# Patient Record
Sex: Male | Born: 1945 | Race: White | Hispanic: No
Health system: Midwestern US, Academic
[De-identification: ages and names within clinical notes are randomized; demographics above are authoritative.]

---

## 2013-03-18 IMAGING — CR UGI AIR W/KUB
4 series · 15 of 24 positions shown · non-contrast
Comparison: None.

HISTORY: Abdominal pain.
TECHNIQUE: Air contrast upper GI.  Fluoro time 1.2 minutes.

[view not recorded (1 of 2)]
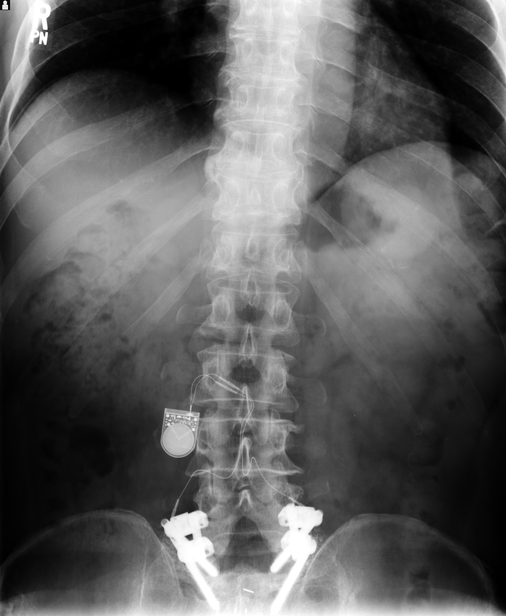

[upper_gi 1 · tomo slice 7/13.0]
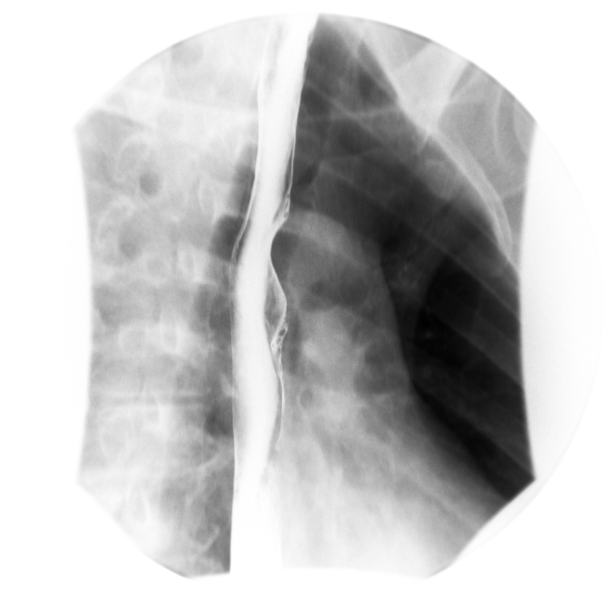

[Series 1: view not recorded · 0.17mm/px · 2 of 3 slices shown (2 of 2)]
[im 1/3]
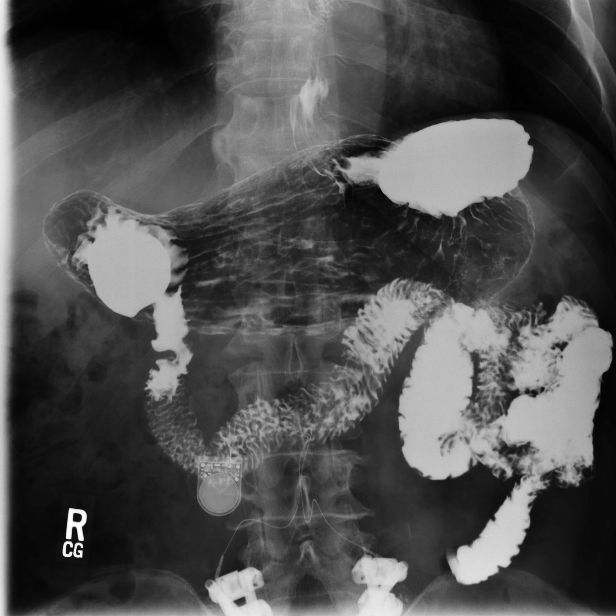
[im 2/3]
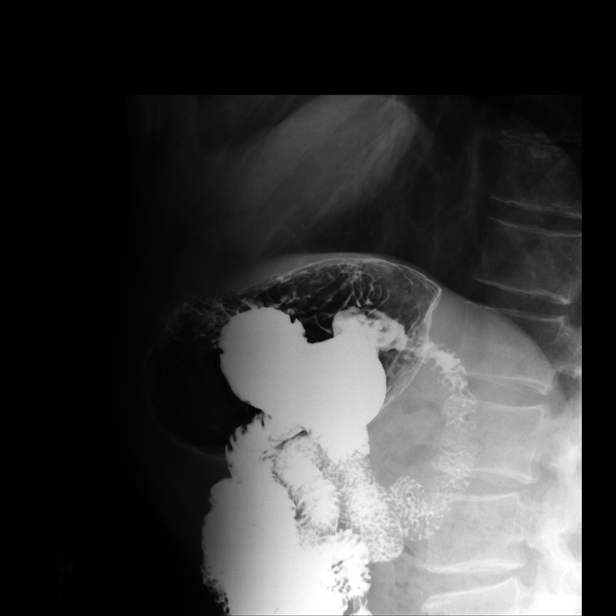

[Series 2: upper_gi 2 · 11 of 17 slices shown]
[im 1/17]
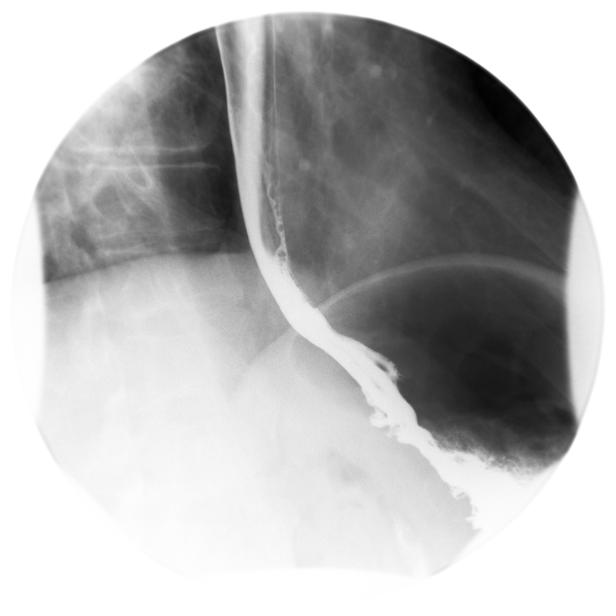
[im 2/17]
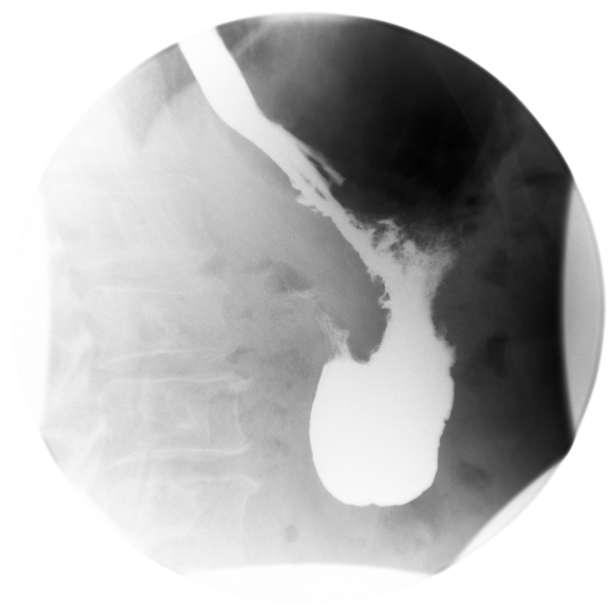
[im 4/17]
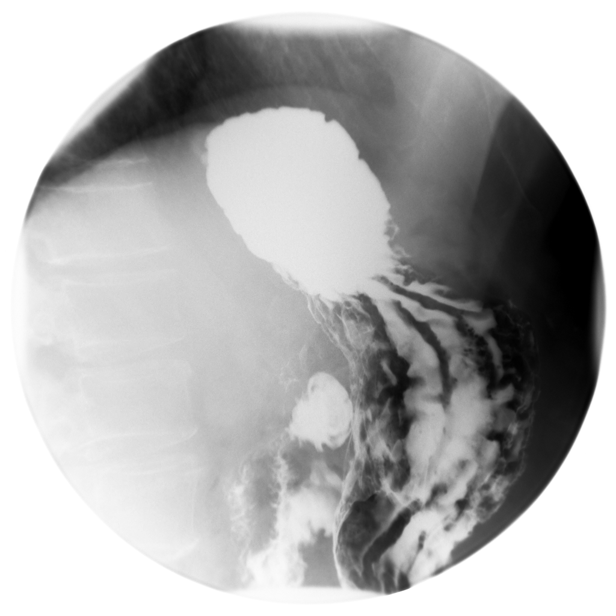
[im 6/17]
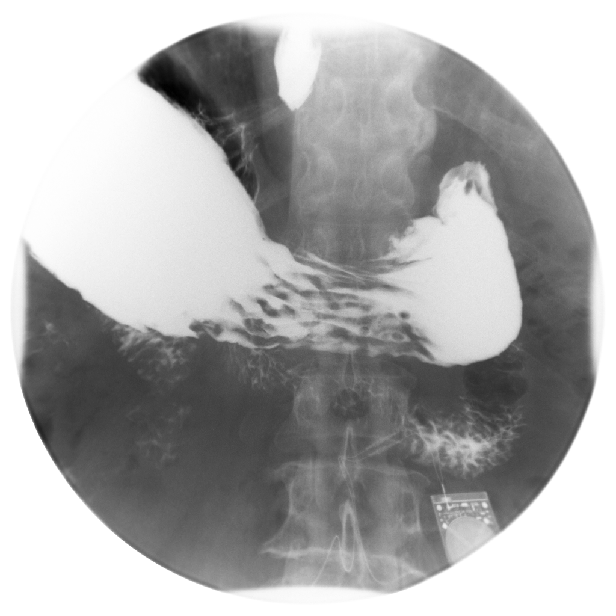
[im 7/17]
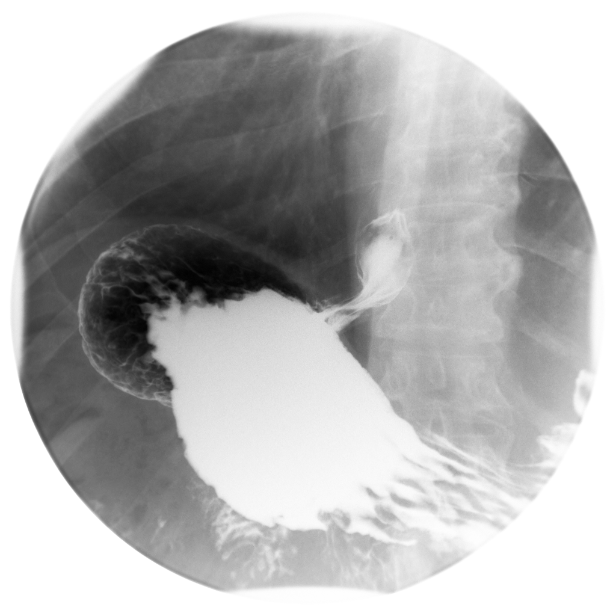
[im 9/17]
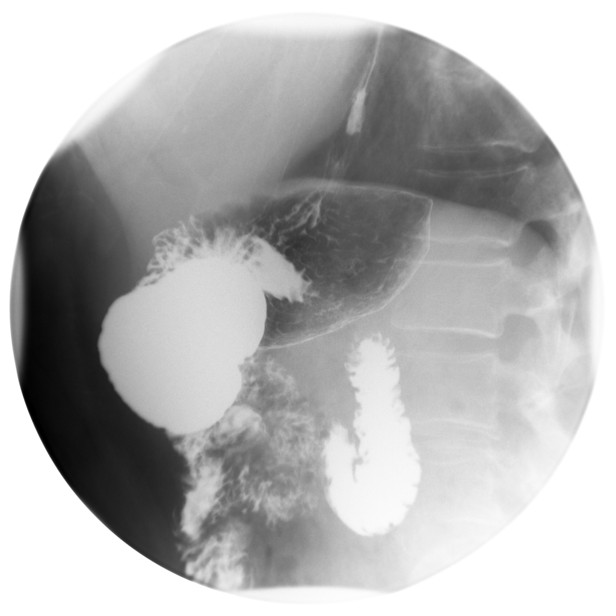
[im 10/17]
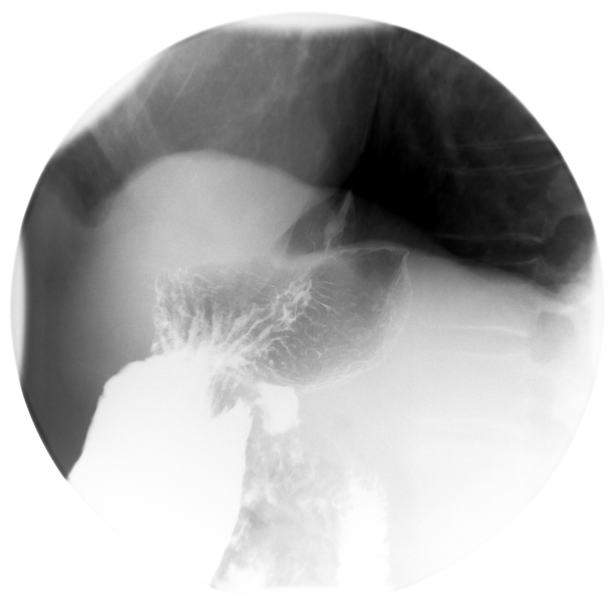
[im 12/17]
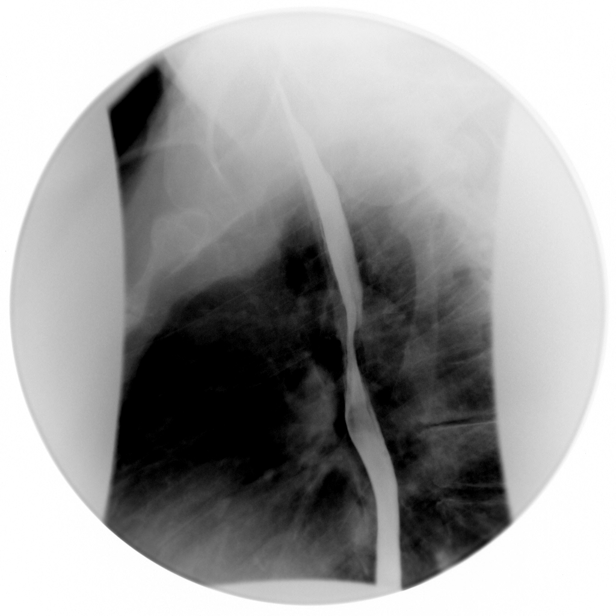
[im 14/17]
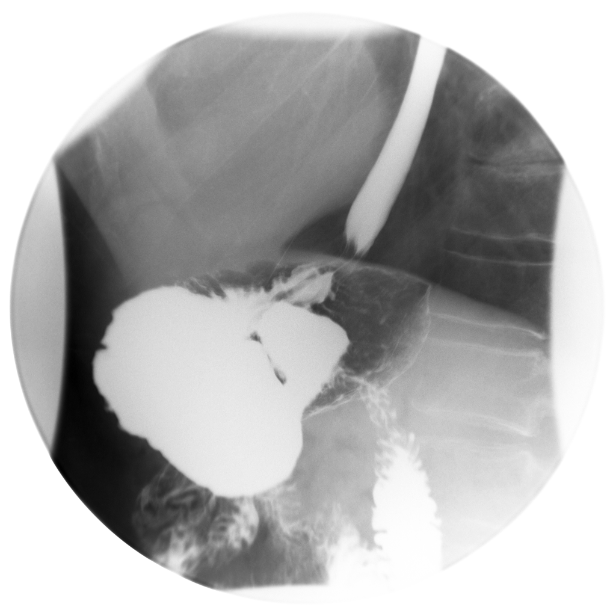
[im 15/17]
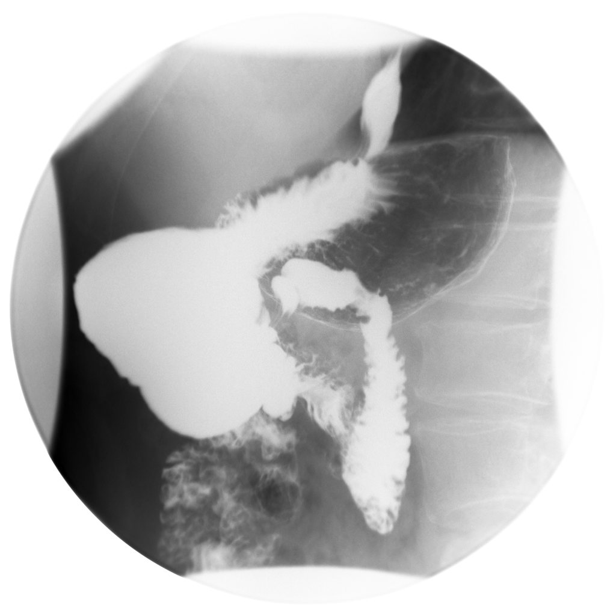
[im 17/17]
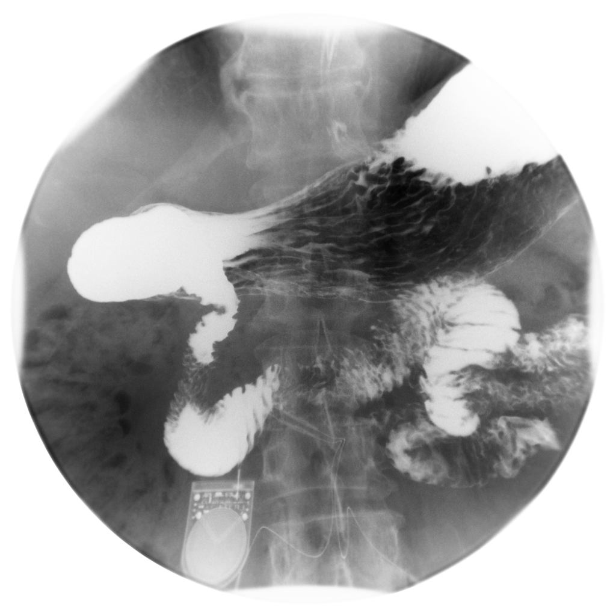

[15 of 24 positions shown; findings below may reference images not displayed]

FINDINGS: Scout KUB shows normal gas pattern. Patient has had back surgery with pedicle screws in the lower lumbar spine with a bone stimulator device. Patient has also had ACDF in the upper C-spine.

Initiation of swallow is normal. Esophagus showed no hiatus hernia or reflux.

Contrast study of stomach was normal without evidence of ulceration or mass and the mucosal pattern was normal and peristalsis was normal.

The duodenal bulb, and duodenal C-loop are normal.
IMPRESSION: Normal air contrast upper GI.

## 2013-03-25 IMAGING — CR KNEE LT 4 VWS MIN
1 series · 4 of 4 positions shown · non-contrast
Comparison: None

Left knee 4 views
INDICATION: Pain

[Series 1: lat · 0.17mm/px · 4 of 4 slices shown]
[im 1/4]
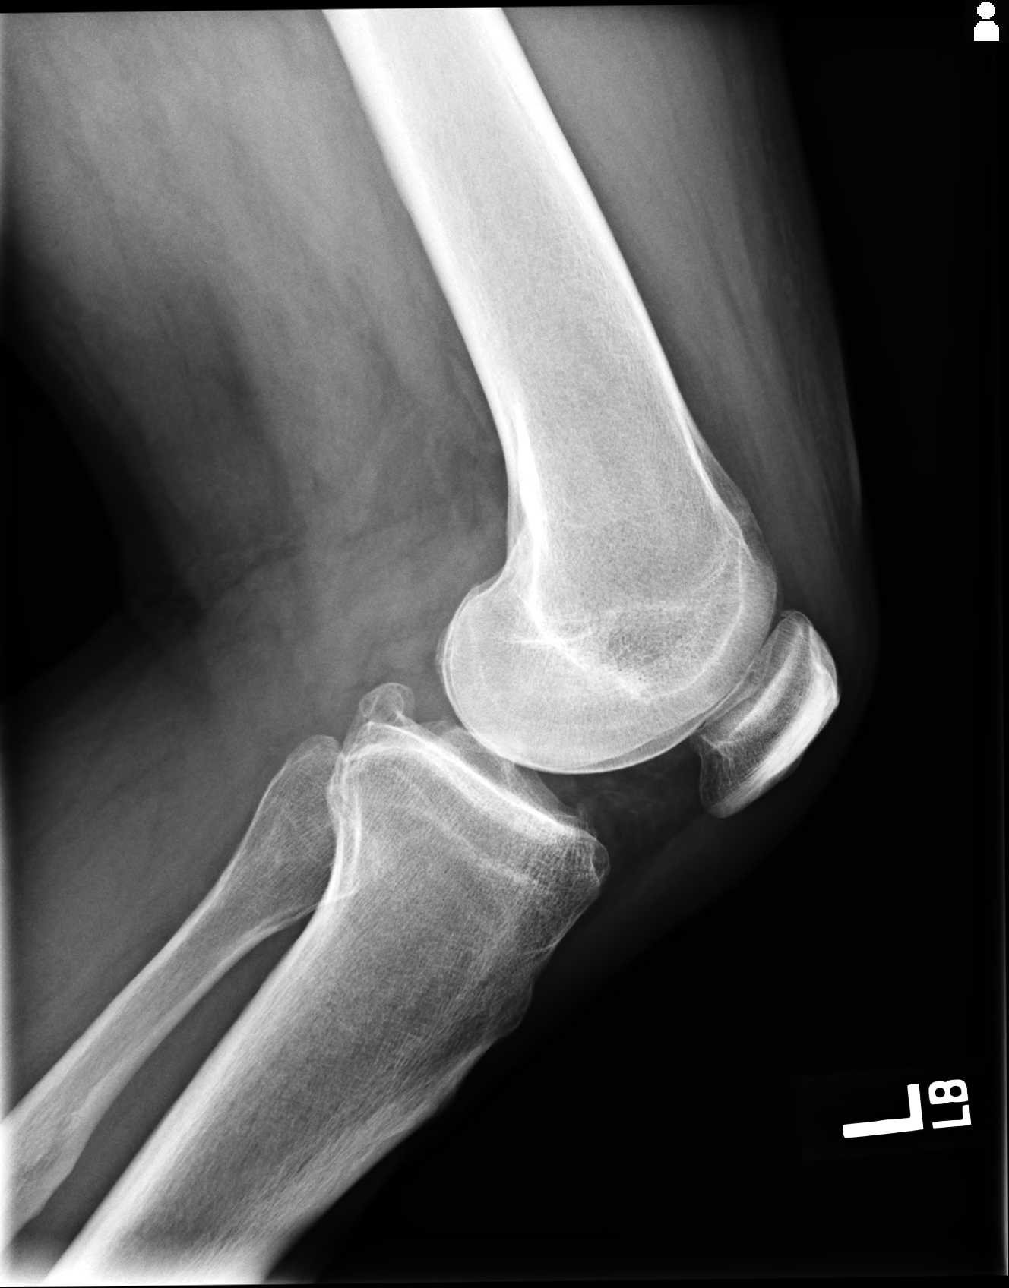
[im 2/4]
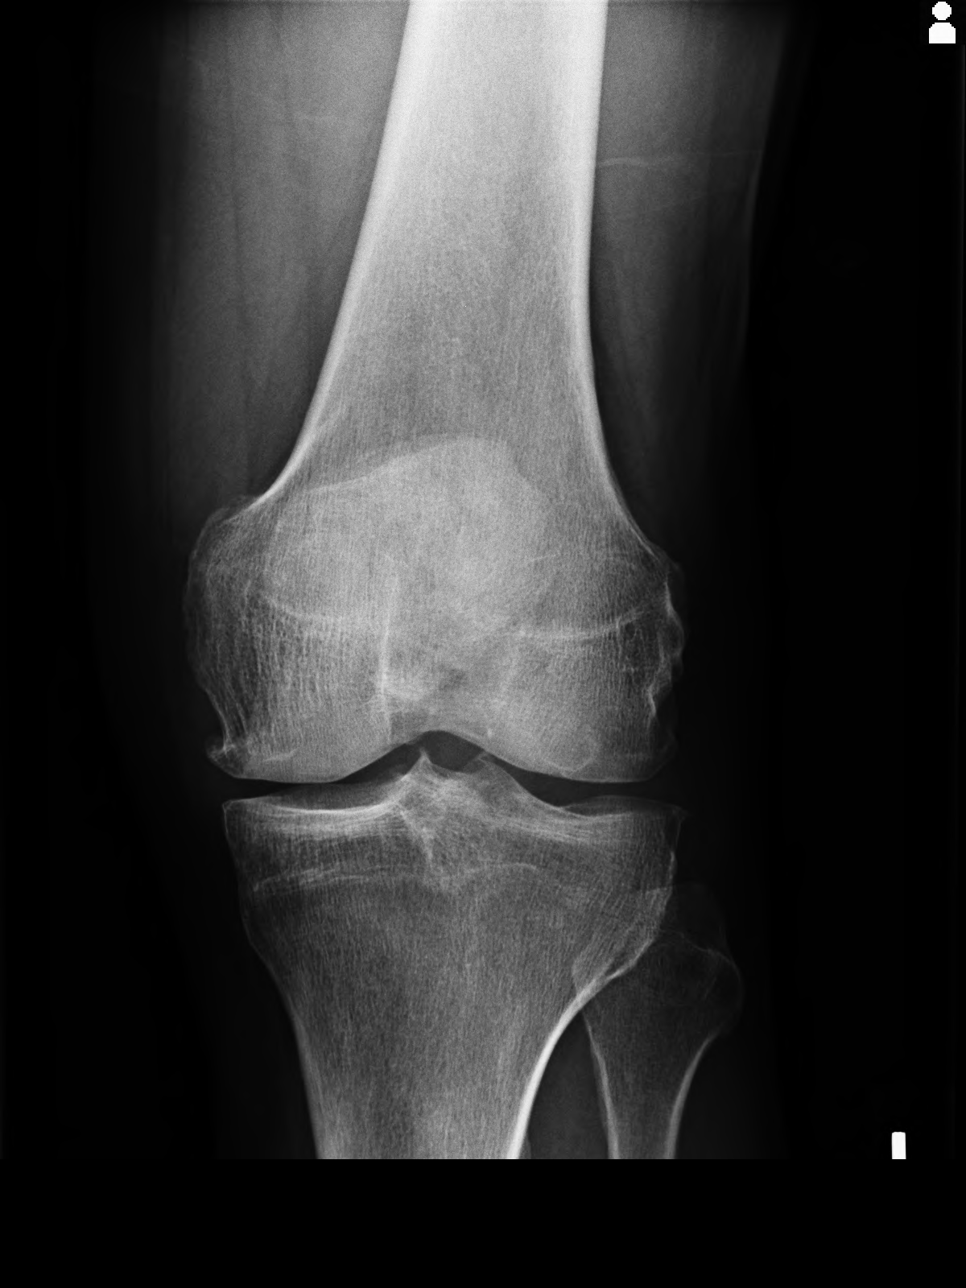
[im 3/4]
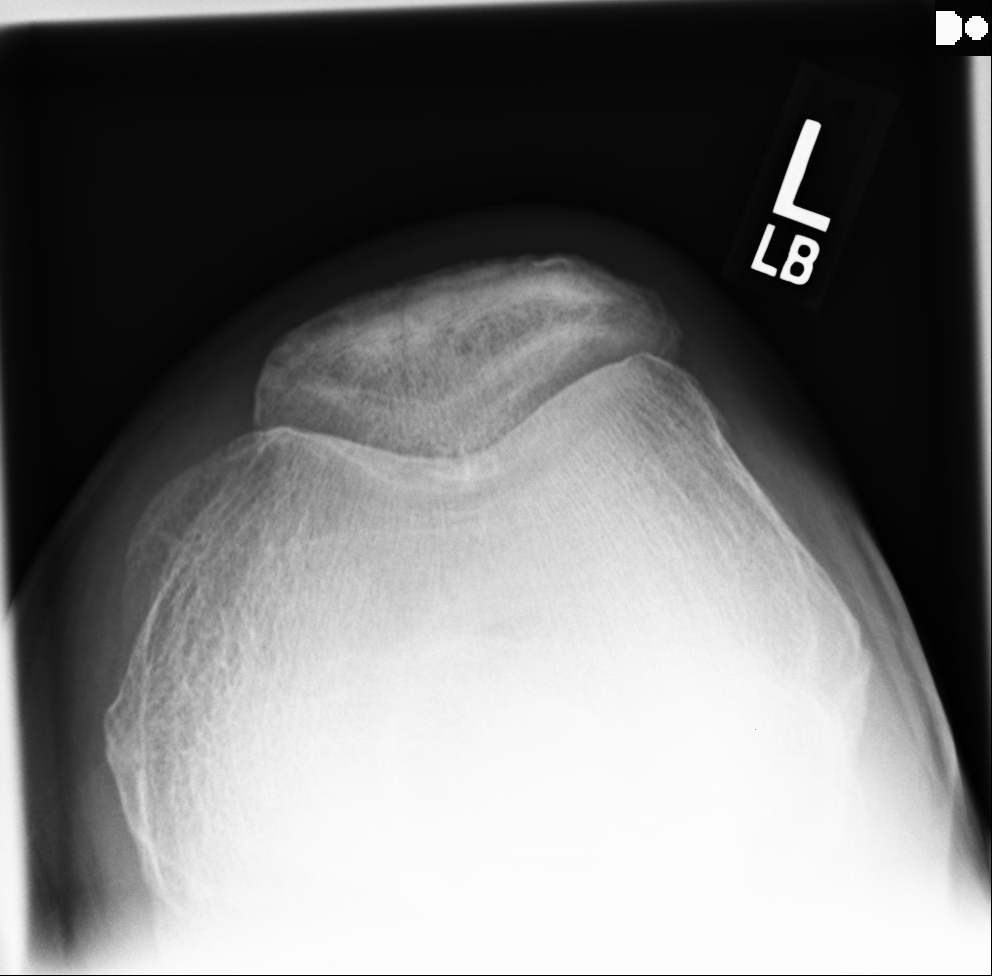
[im 4/4]
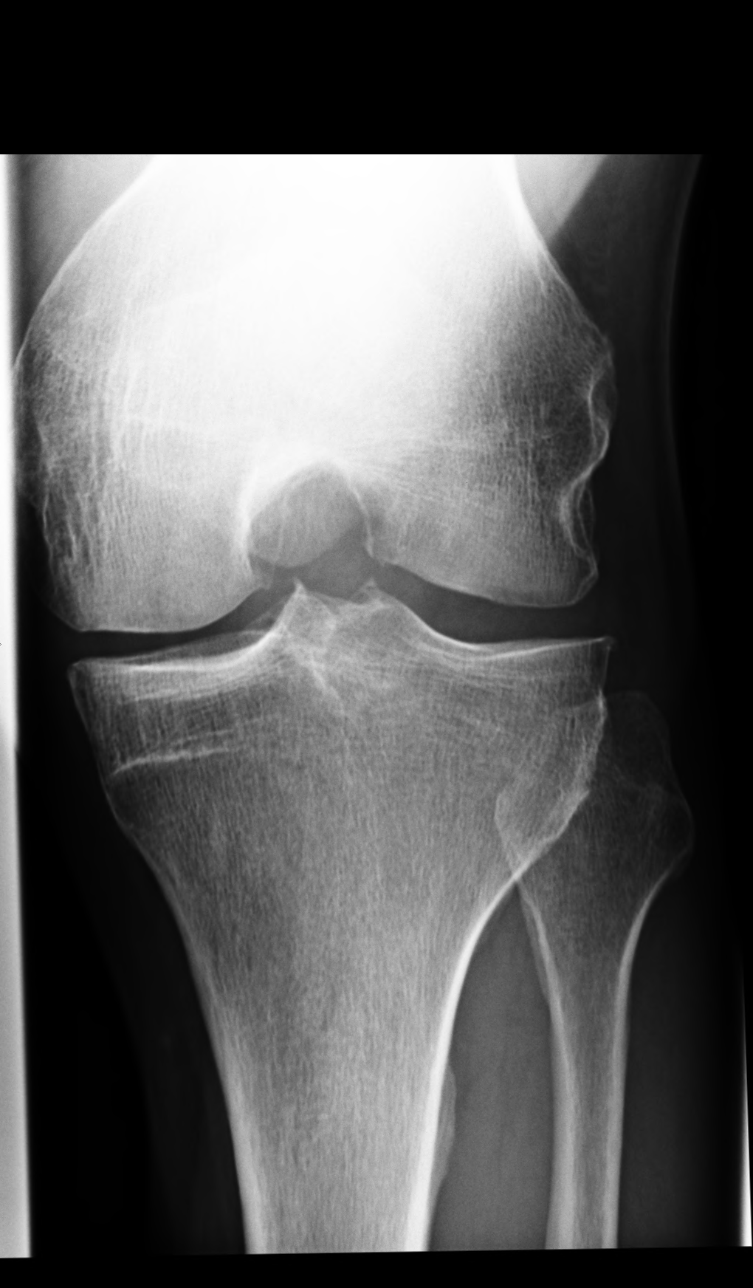

[4 of 4 positions shown; findings below may reference images not displayed]

There are mild medial and lateral compartment degenerative spurs. Otherwise unremarkable for age.

## 2019-07-02 IMAGING — CR C-SPINE 4 or 5 views
5 series · 5 of 5 positions shown · non-contrast
Comparison: None.

REASON FOR EXAM: Pain.

[w cervical spine lat]
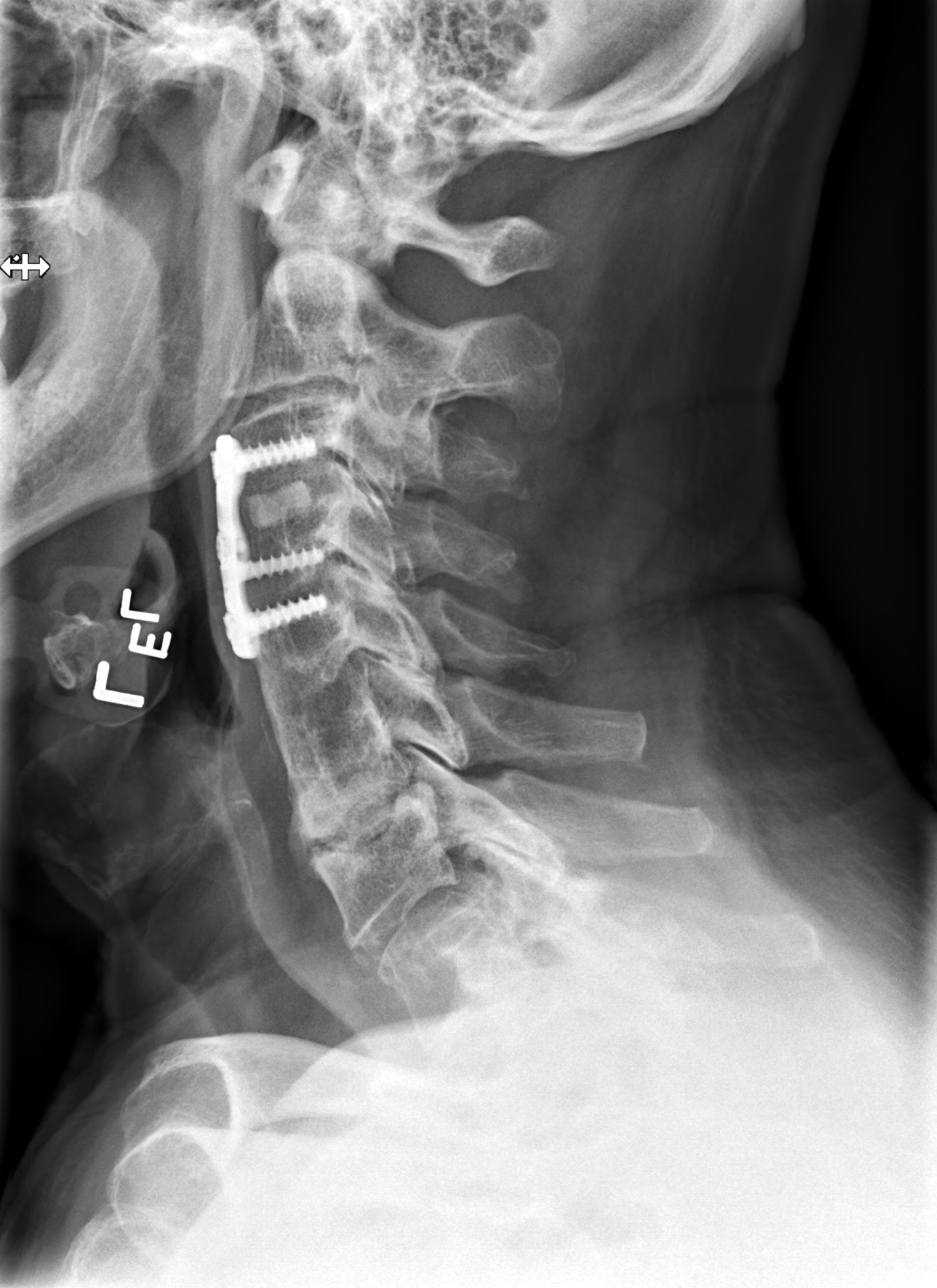

[w cervical spine odontoid]
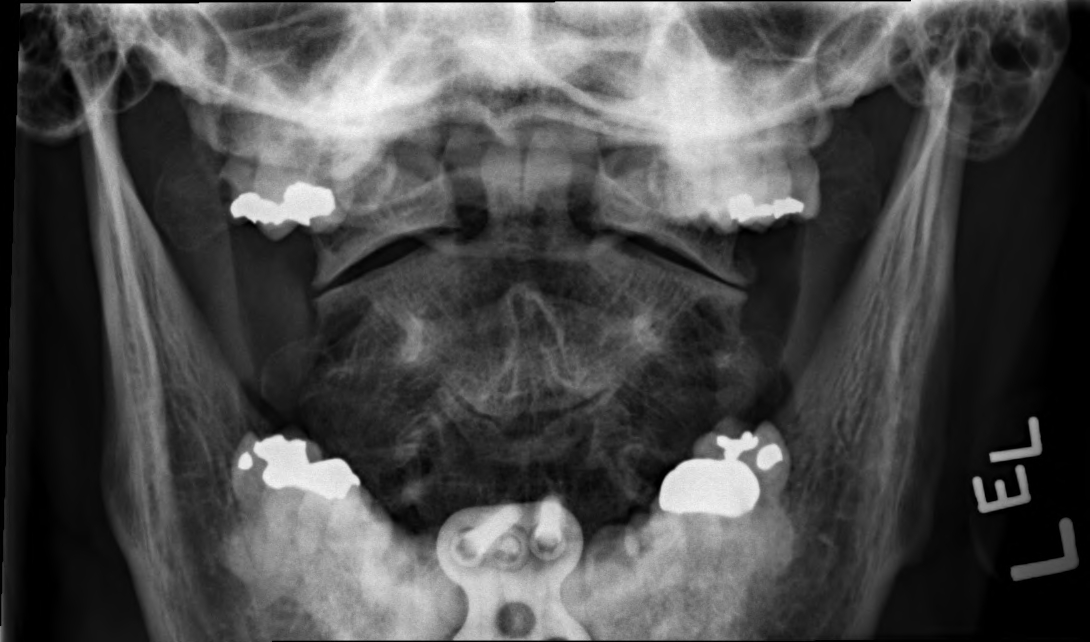

[w cervical spine ap]
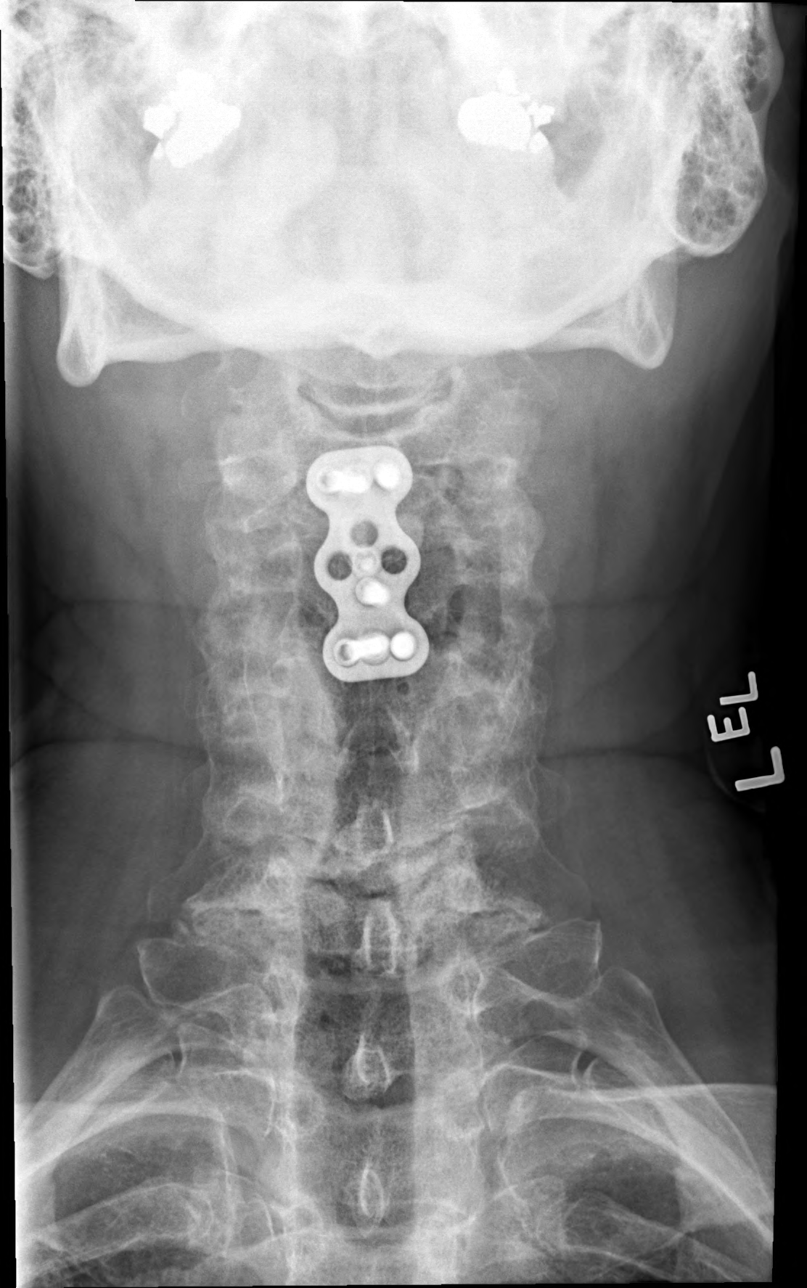

[w cervical spine ap_obl (1 of 2)]
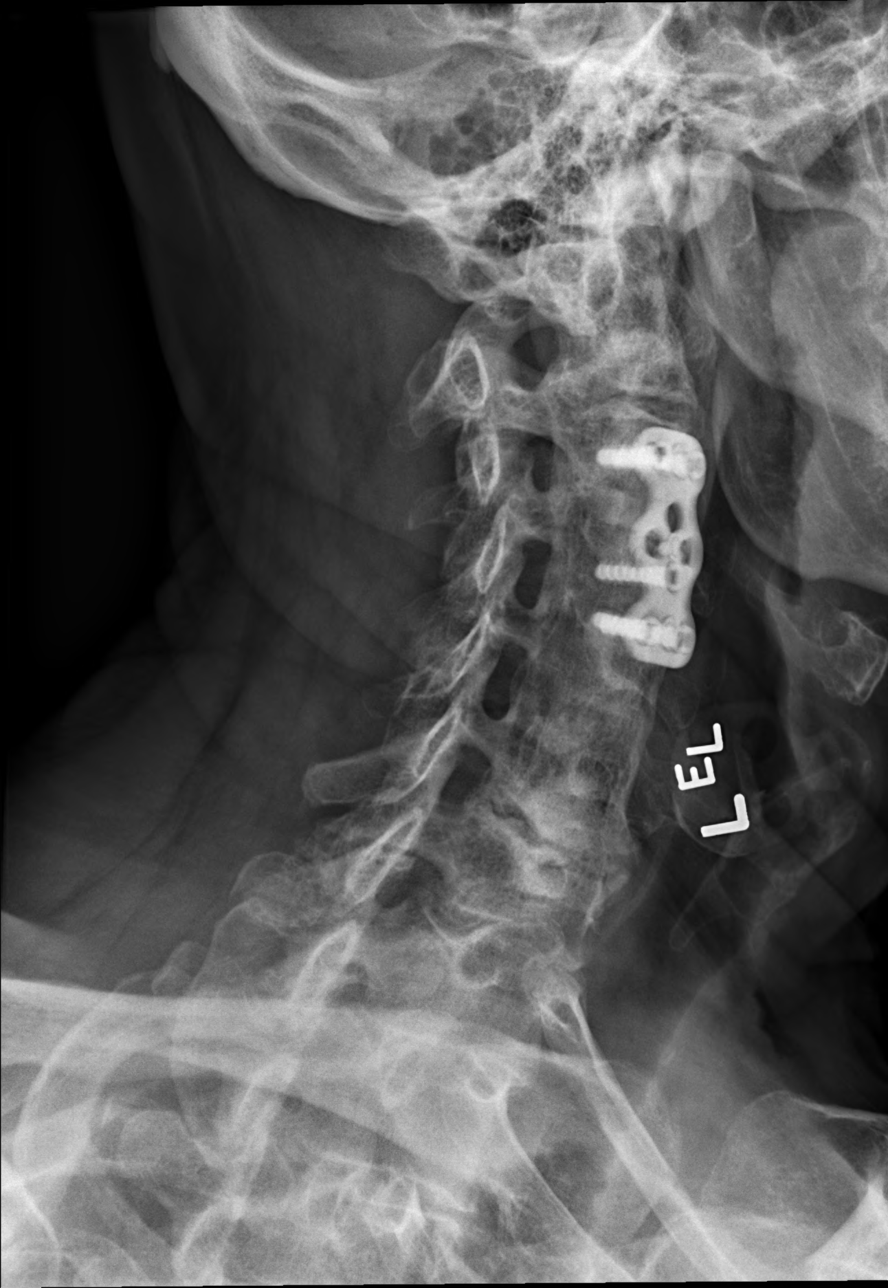

[w cervical spine ap_obl (2 of 2)]
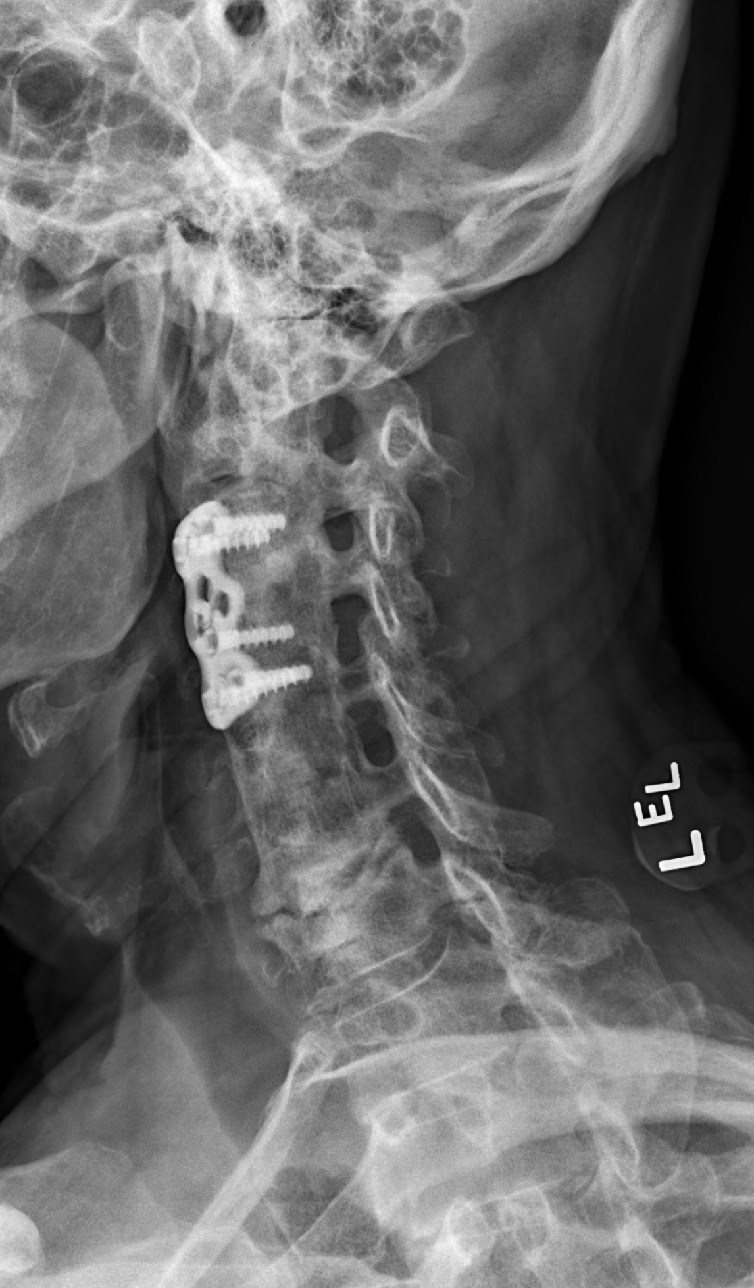

[5 of 5 positions shown; findings below may reference images not displayed]

TECHNIQUE AND FINDINGS: 5 views of the cervical spine shows patient is status post fusion from C3 through C6. Fusion is complete. Vertebral body screws, anterior fixation plate and disc spacer material is seen from C3 through C5. There is disc irregularity and sclerosis seen at C6-C7. Facets appear to be in good alignment. The base of the dens is intact. Prevertebral soft tissues are normal.
IMPRESSION: Post cervical fusion changes.

## 2020-03-11 IMAGING — CR L-SPINE 4 VWS MIN
4 series · 4 of 4 positions shown · non-contrast
Comparison: None.

HISTORY: 74-year-old male with low back pain.
TECHNIQUE: AP standing, lateral neutral, lateral flexion and lateral extension views. A total of 4 views.

[w lumbar spine ap]
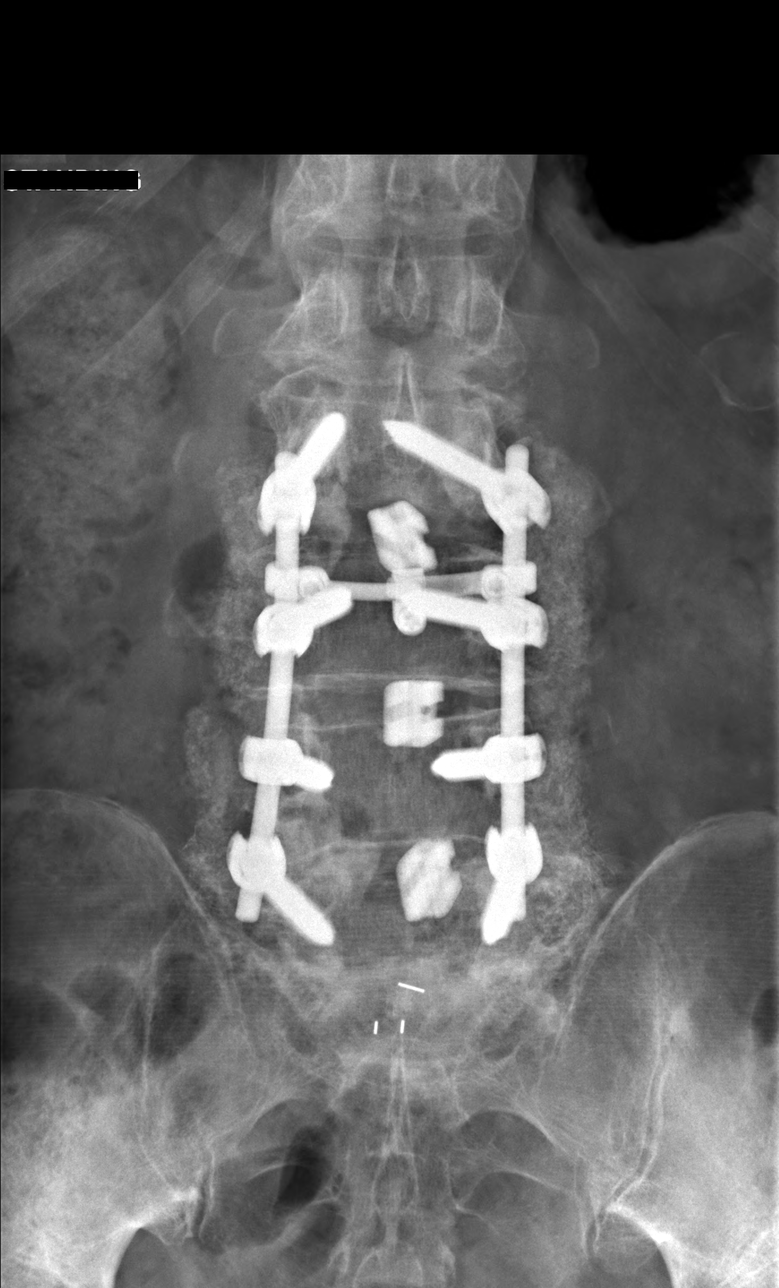

[w lumbar spine lat]
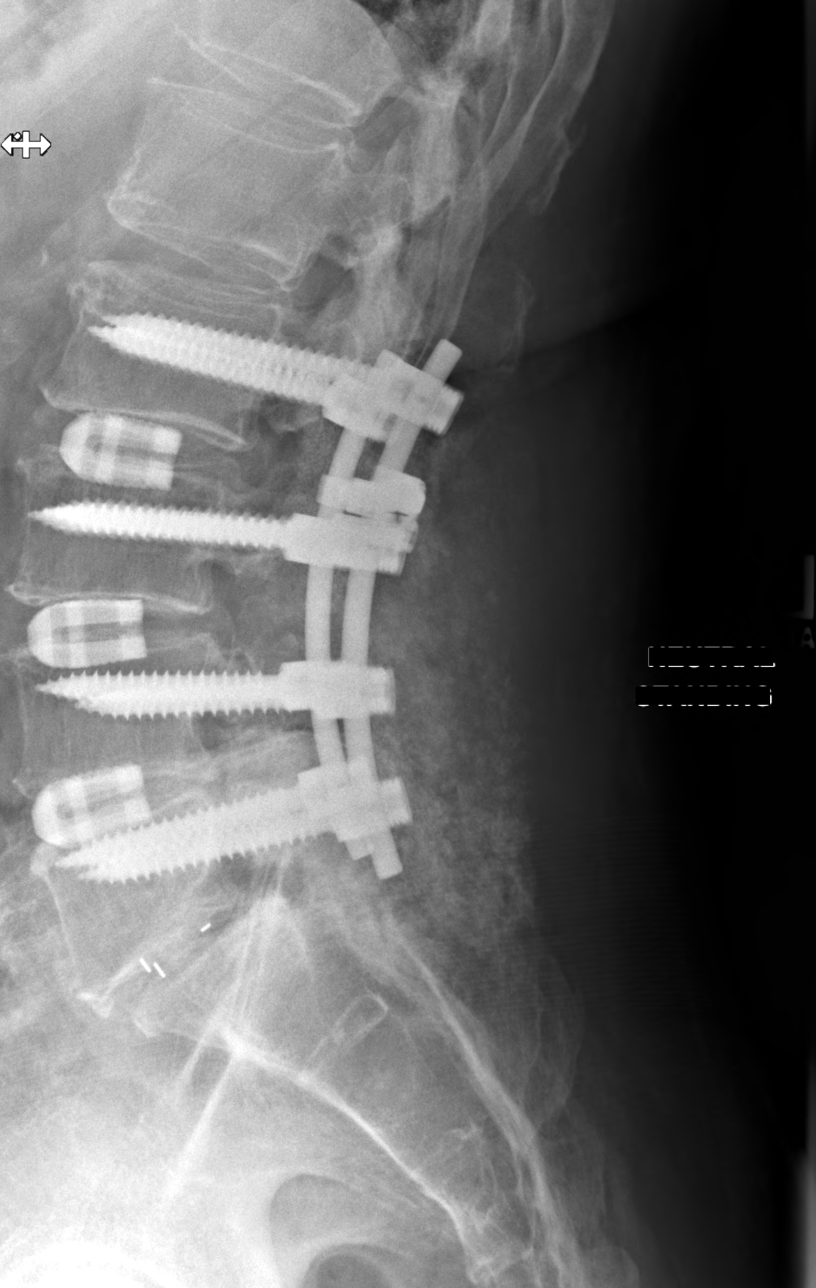

[w lumbar spine flexion]
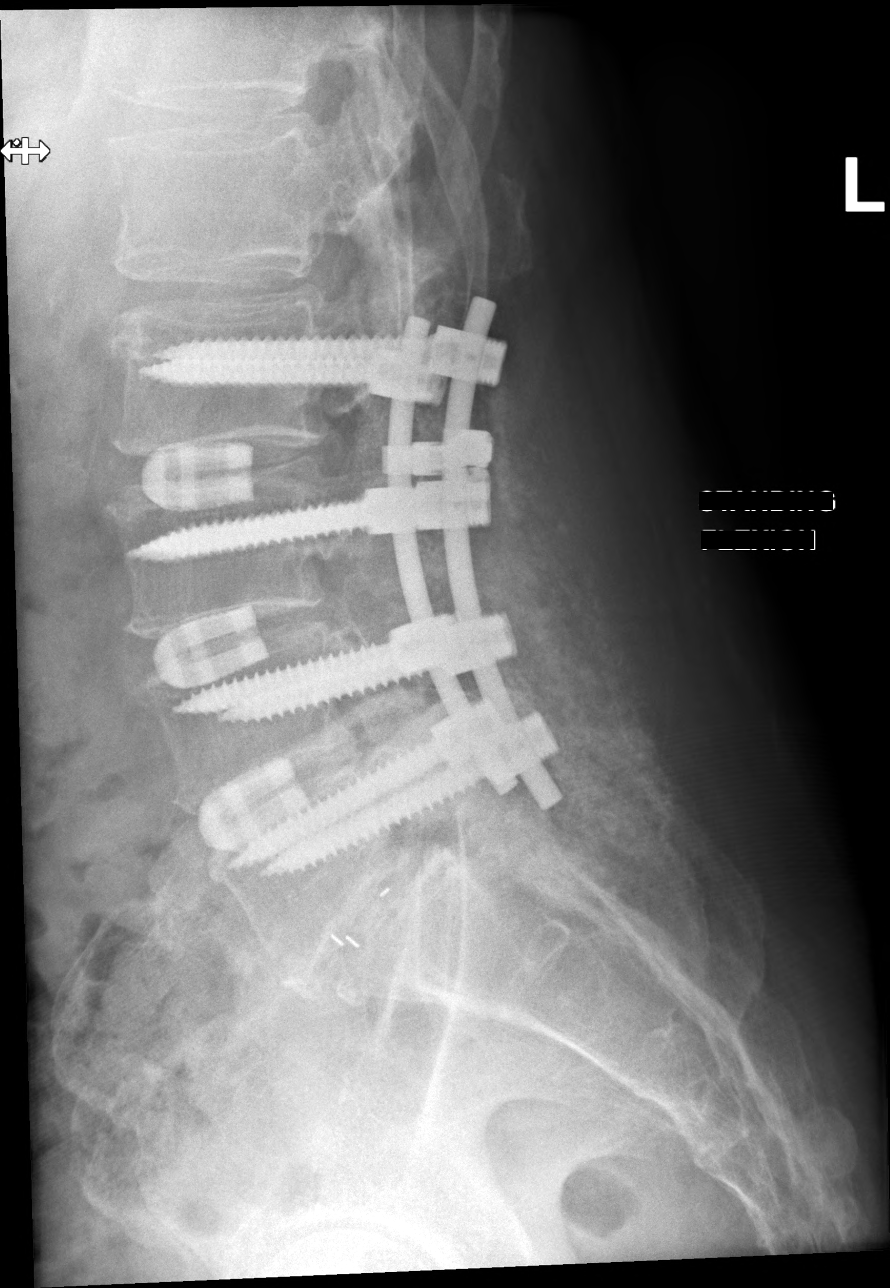

[w lumbar spine extension]
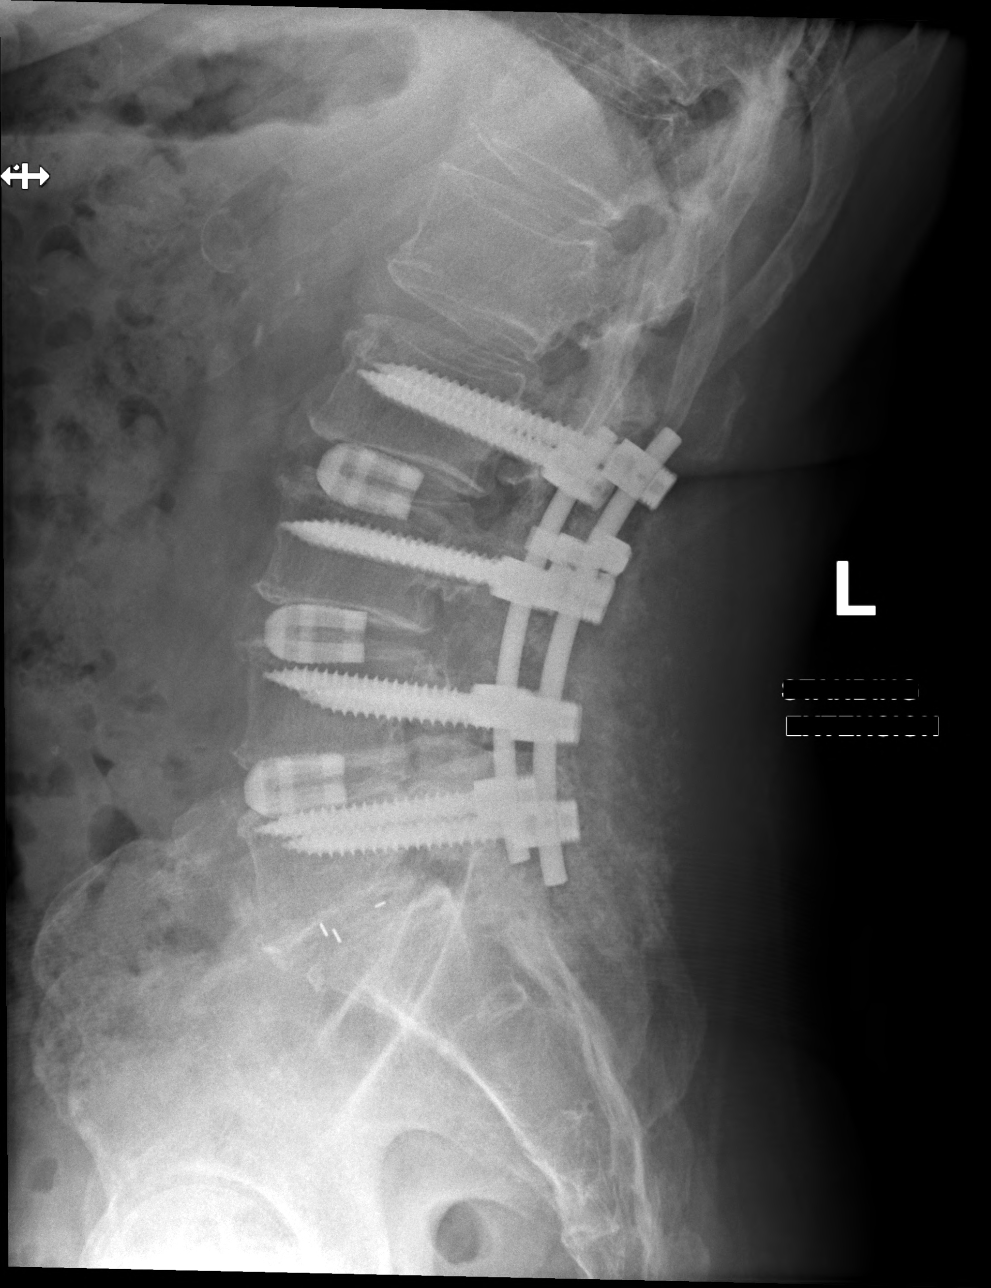

[4 of 4 positions shown; findings below may reference images not displayed]

FINDINGS: Patient is status post L2-L5 laminectomies and transpedicular fusion. Disc space implants are present at the L2-3, L3-4, L4-5 and L5-S1 levels. Bone graft is also present. There is a mild grade I anterior spondylolisthesis of L5 over S1 level. There is no significant subluxation between flexion and extension views of the lumbar spine. There are vascular calcifications.
IMPRESSION: 1. Postsurgical changes described as above.

2. Mild grade I anterior spondylolisthesis of L5 over S1 level. There is no significant subluxation between flexion and extension views of the lumbar spine.

## 2020-06-16 IMAGING — CR L-SPINE 4 VWS MIN
4 series · 4 of 4 positions shown · non-contrast
Comparison: 03/11/20

Lumbar spine 4 views including standing flexion and extension
HISTORY: Low back pain

[w lumbar spine ap]
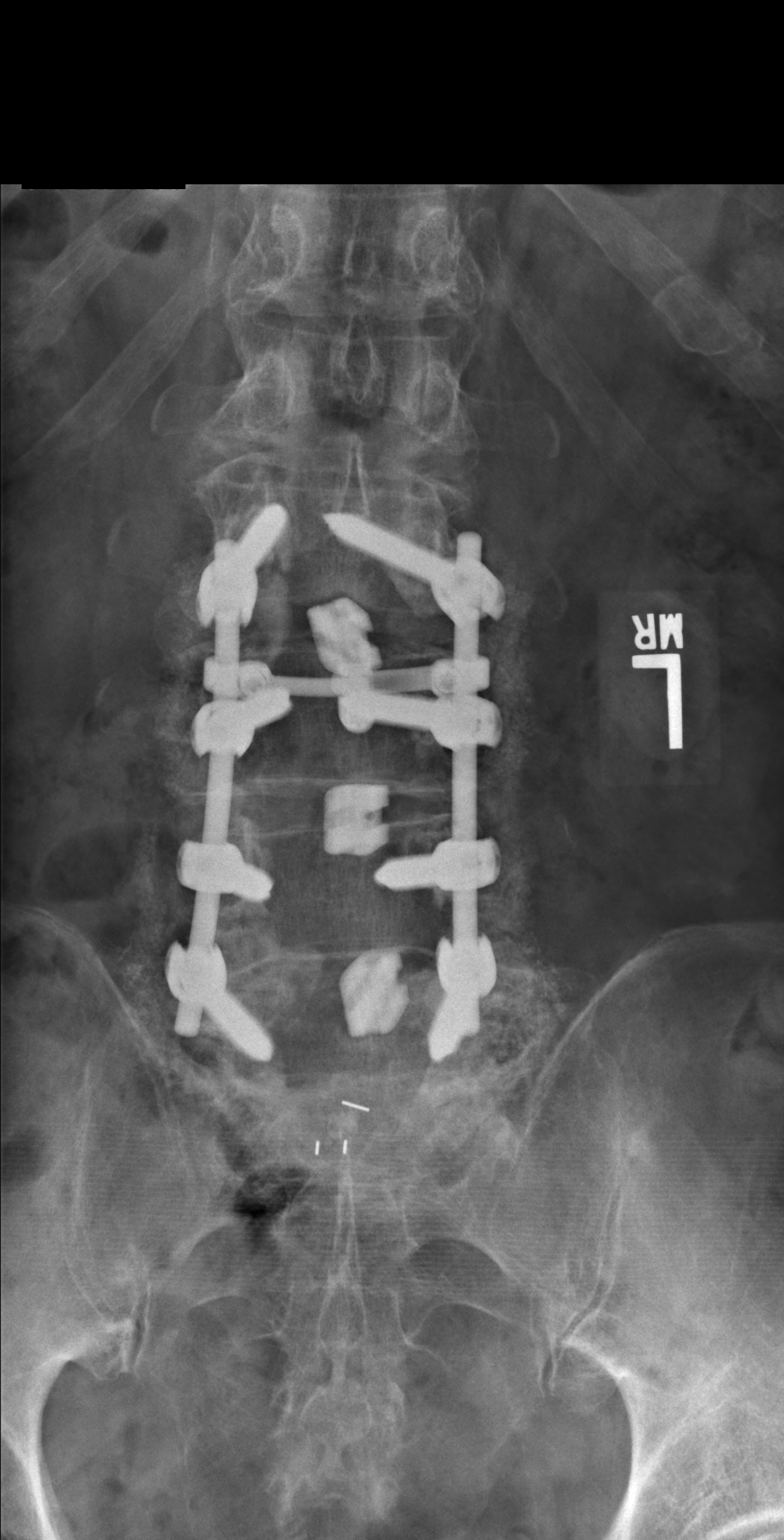

[w lumbar spine lat]
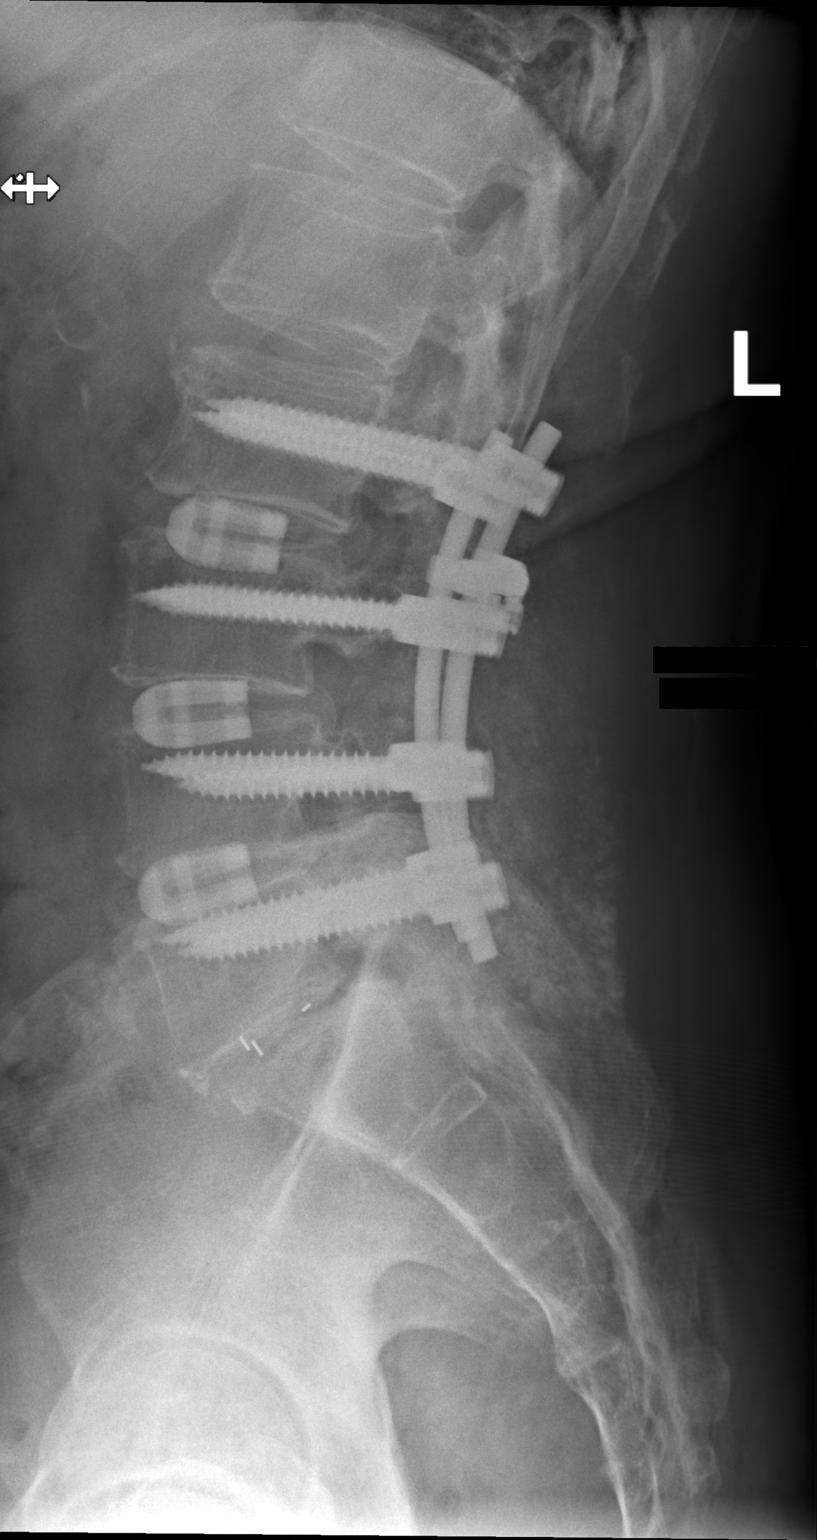

[w lumbar spine flexion (1 of 2)]
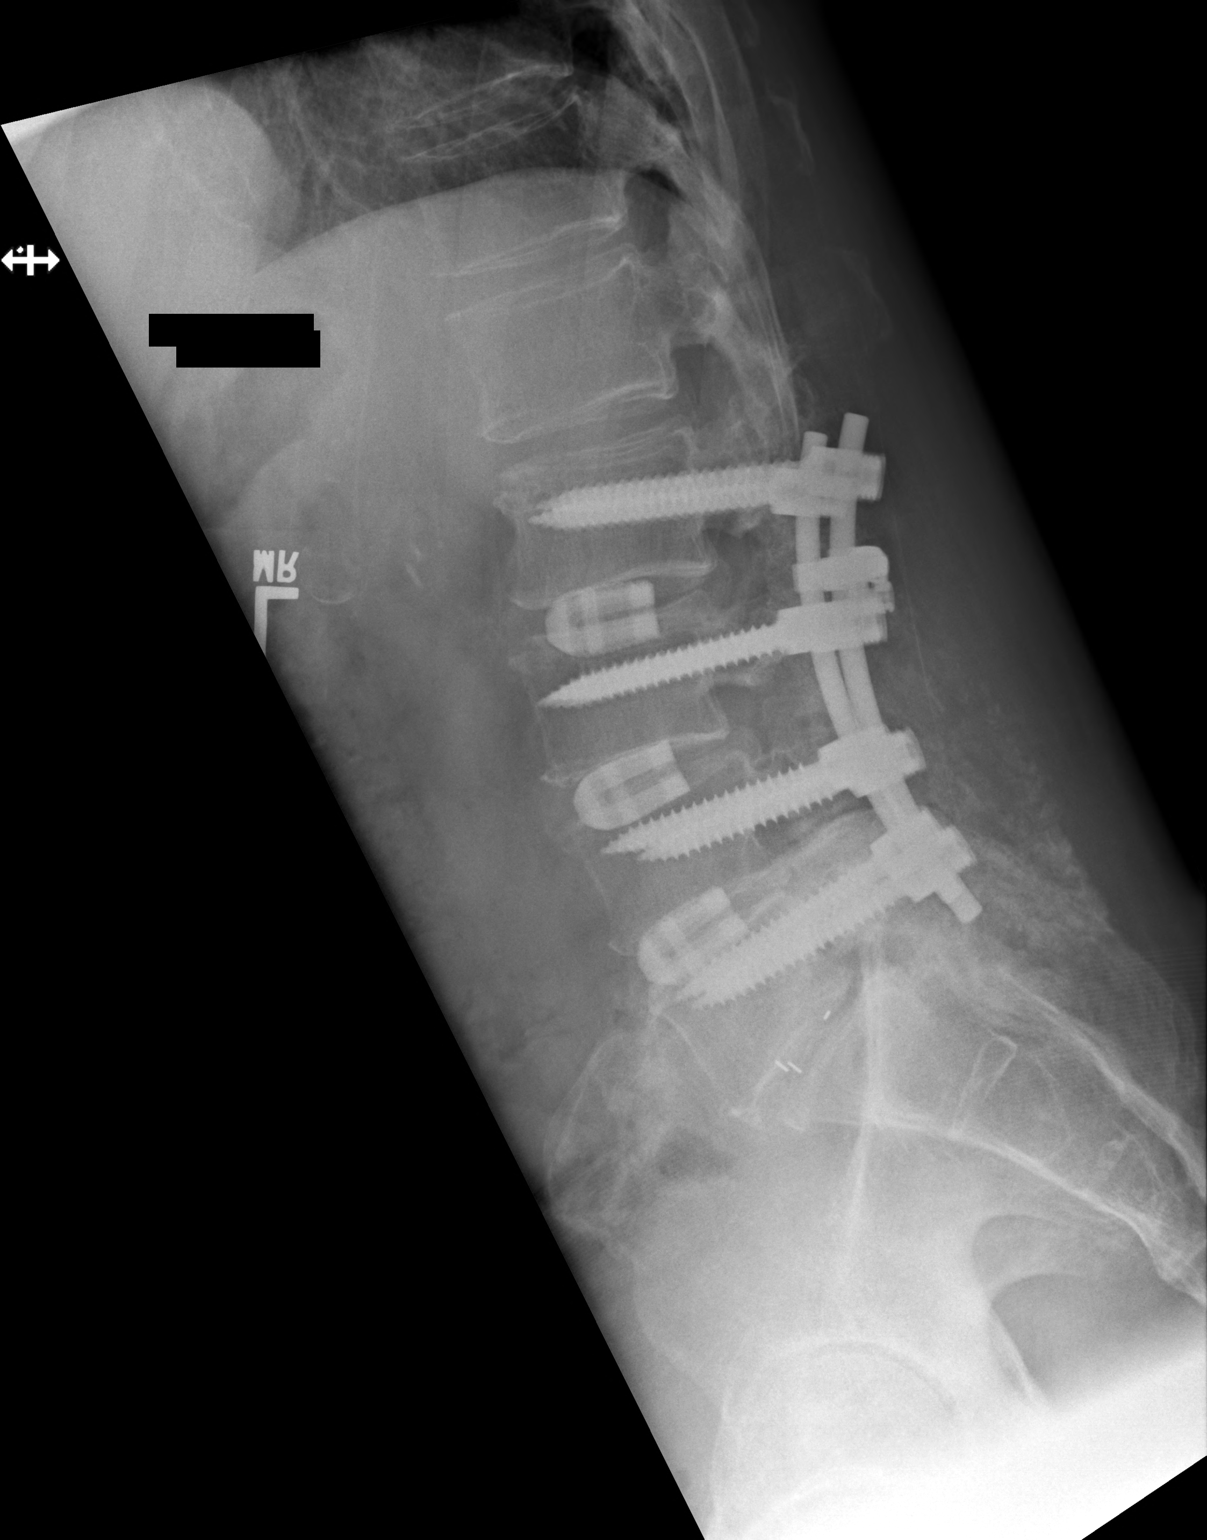

[w lumbar spine flexion (2 of 2)]
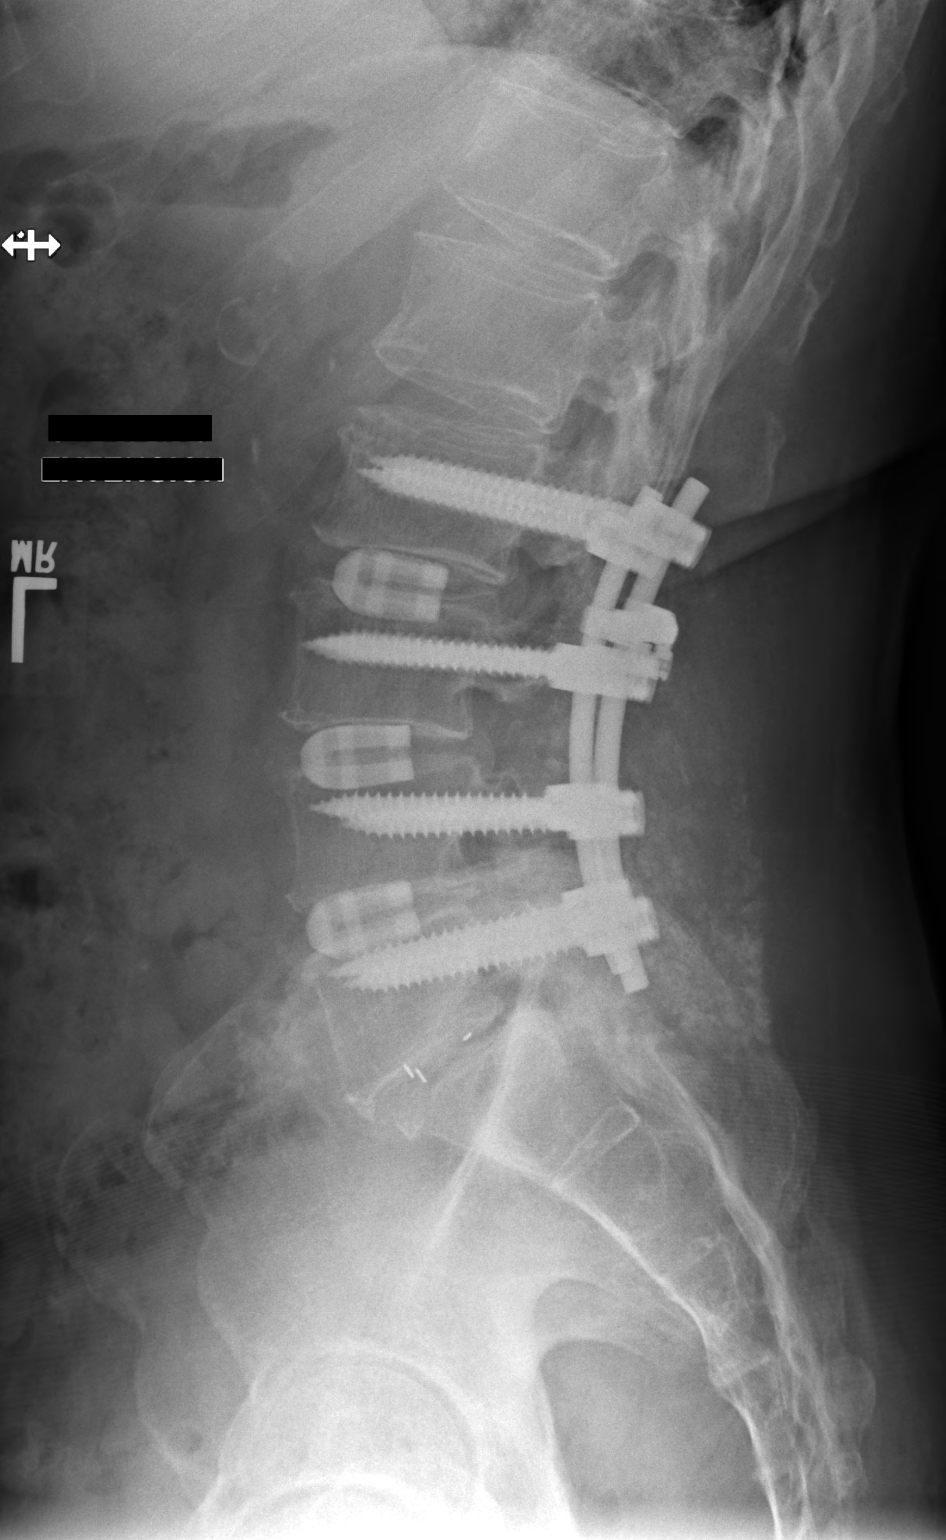

[4 of 4 positions shown; findings below may reference images not displayed]

FINDINGS: L2-L5 interdisc and hardware fusions look the same without sign of loosening, subluxation or wedge compression deformity. Severe L5-S1 disc space narrowing with disc spacer, stable.

Mild SIJ DJD. Demineralized bones. Calcified aortic arterial plaques.

In between flexion and extension, I see no abnormal excursion.
IMPRESSION: Stable lumbar fusion.

## 2020-07-08 ENCOUNTER — Encounter: Admit: 2020-07-08 | Discharge: 2020-07-08

## 2020-07-08 ENCOUNTER — Inpatient Hospital Stay: Admit: 2020-07-08 | Discharge: 2020-07-08

## 2020-07-08 ENCOUNTER — Ambulatory Visit: Admit: 2020-07-08 | Discharge: 2020-07-08

## 2020-07-08 DIAGNOSIS — S065X9A Traumatic subdural hemorrhage with loss of consciousness of unspecified duration, initial encounter: Secondary | ICD-10-CM

## 2020-07-08 DIAGNOSIS — K2270 Barrett's esophagus without dysplasia: Secondary | ICD-10-CM

## 2020-07-08 DIAGNOSIS — G4733 Obstructive sleep apnea (adult) (pediatric): Secondary | ICD-10-CM

## 2020-07-08 DIAGNOSIS — E559 Vitamin D deficiency, unspecified: Secondary | ICD-10-CM

## 2020-07-08 DIAGNOSIS — G44029 Chronic cluster headache, not intractable: Secondary | ICD-10-CM

## 2020-07-08 DIAGNOSIS — F17200 Nicotine dependence, unspecified, uncomplicated: Secondary | ICD-10-CM

## 2020-07-08 DIAGNOSIS — N400 Enlarged prostate without lower urinary tract symptoms: Secondary | ICD-10-CM

## 2020-07-08 DIAGNOSIS — K219 Gastro-esophageal reflux disease without esophagitis: Secondary | ICD-10-CM

## 2020-07-08 DIAGNOSIS — M542 Cervicalgia: Secondary | ICD-10-CM

## 2020-07-08 DIAGNOSIS — R7301 Impaired fasting glucose: Secondary | ICD-10-CM

## 2020-07-08 DIAGNOSIS — E782 Mixed hyperlipidemia: Secondary | ICD-10-CM

## 2020-07-08 DIAGNOSIS — E039 Hypothyroidism, unspecified: Secondary | ICD-10-CM

## 2020-07-08 DIAGNOSIS — Z9981 Dependence on supplemental oxygen: Secondary | ICD-10-CM

## 2020-07-08 DIAGNOSIS — J449 Chronic obstructive pulmonary disease, unspecified: Secondary | ICD-10-CM

## 2020-07-08 DIAGNOSIS — Z87898 Personal history of other specified conditions: Secondary | ICD-10-CM

## 2020-07-08 DIAGNOSIS — F4312 Post-traumatic stress disorder, chronic: Secondary | ICD-10-CM

## 2020-07-08 LAB — CANNABINOIDS-URINE RANDOM: CANNABINOIDS (THC): POSITIVE — AB

## 2020-07-08 LAB — COMPREHENSIVE METABOLIC PANEL
ALBUMIN: 4.3 g/dL — ABNORMAL HIGH (ref 3.5–5.0)
ALK PHOSPHATASE: 53 U/L — ABNORMAL LOW (ref 25–110)
ALT: 24 U/L (ref 7–56)
ANION GAP: 12 K/UL — ABNORMAL HIGH (ref 3–12)
BLD UREA NITROGEN: 17 mg/dL (ref 7–25)
CALCIUM: 8.9 mg/dL (ref 8.5–10.6)
CHLORIDE: 102 MMOL/L (ref 98–110)
CO2: 25 MMOL/L — ABNORMAL LOW (ref 21–30)
CREATININE: 0.9 mg/dL (ref 0.4–1.24)
GLUCOSE,PANEL: 120 mg/dL — ABNORMAL HIGH (ref 70–100)
POTASSIUM: 3.5 MMOL/L (ref 3.5–5.1)
SODIUM: 139 MMOL/L (ref 137–147)
TOTAL BILIRUBIN: 0.5 mg/dL (ref 0.3–1.2)
TOTAL PROTEIN: 6.4 g/dL (ref 6.0–8.0)

## 2020-07-08 LAB — CBC AND DIFF
ABSOLUTE BASO COUNT: 0 K/UL (ref <150)
ABSOLUTE EOS COUNT: 0 K/UL (ref <200)
ABSOLUTE LYMPH COUNT: 1.8 K/UL (ref >60)
WBC COUNT: 13 K/UL — ABNORMAL HIGH (ref 4.5–11.0)

## 2020-07-08 LAB — TROPONIN-I: TROPONIN I: 0 ng/mL — ABNORMAL HIGH (ref 0.0–0.05)

## 2020-07-08 LAB — PHOSPHORUS: PHOSPHORUS: 3.5 mg/dL (ref 2.0–4.5)

## 2020-07-08 LAB — PHENCYCLIDINES-URINE RANDOM: PHENCYCLIDINE (PCP): NEGATIVE

## 2020-07-08 LAB — FENTANYL URINE: FENTANYL URINE: NEGATIVE

## 2020-07-08 LAB — BARBITURATES-URINE RANDOM: BARBITURATES: NEGATIVE

## 2020-07-08 LAB — LIPID PROFILE
HDL: 52 mg/dL (ref >40)
LDL: 70 mg/dL (ref <100)
NON HDL CHOLESTEROL: 85 mg/dL
VLDL: 12 mg/dL

## 2020-07-08 LAB — AMPHETAMINES-URINE RANDOM: AMPHETAMINES: NEGATIVE

## 2020-07-08 LAB — OPIATES-URINE RANDOM: OPIATES: NEGATIVE

## 2020-07-08 LAB — COCAINE-URINE RANDOM: COCAINE: NEGATIVE

## 2020-07-08 LAB — BENZODIAZEPINES-URINE RANDOM: BENZODIAZEPINES: NEGATIVE

## 2020-07-08 MED ORDER — LORATADINE 10 MG PO TAB
10 mg | Freq: Every morning | ORAL | 0 refills | Status: AC
Start: 2020-07-08 — End: ?
  Administered 2020-07-09: 13:00:00 10 mg via ORAL

## 2020-07-08 MED ORDER — POTASSIUM CHLORIDE IN WATER 10 MEQ/50 ML IV PGBK
10 meq | INTRAVENOUS | 0 refills | Status: AC | PRN
Start: 2020-07-08 — End: ?

## 2020-07-08 MED ORDER — CHOLECALCIFEROL (VITAMIN D3) 25 MCG (1,000 UNIT) PO TAB
1000 [IU] | Freq: Every day | ORAL | 0 refills | Status: AC
Start: 2020-07-08 — End: ?
  Administered 2020-07-09: 13:00:00 1000 [IU] via ORAL

## 2020-07-08 MED ORDER — HELP MEDICATION
Freq: Every day | 0 refills | Status: DC
Start: 2020-07-08 — End: 2020-07-09

## 2020-07-08 MED ORDER — MAGNESIUM HYDROXIDE 2,400 MG/10 ML PO SUSP
10 mL | Freq: Every day | ORAL | 0 refills | Status: AC
Start: 2020-07-08 — End: ?

## 2020-07-08 MED ORDER — SODIUM CHLORIDE 0.9 % IV SOLP
INTRAVENOUS | 0 refills | Status: AC
Start: 2020-07-08 — End: ?
  Administered 2020-07-09: 02:00:00 1000.000 mL via INTRAVENOUS

## 2020-07-08 MED ORDER — LISINOPRIL-HYDROCHLOROTHIAZIDE 20-25 MG TAB
Freq: Once | ORAL | 0 refills | Status: CP
Start: 2020-07-08 — End: ?
  Administered 2020-07-09 (×2): via ORAL

## 2020-07-08 MED ORDER — CALCIUM GLUC IN NACL, ISO-OSM 1 GRAM/100 ML IV SOLN
1 g | INTRAVENOUS | 0 refills | Status: AC | PRN
Start: 2020-07-08 — End: ?

## 2020-07-08 MED ORDER — SENNOSIDES-DOCUSATE SODIUM 8.6-50 MG PO TAB
1 | Freq: Two times a day (BID) | ORAL | 0 refills | Status: AC
Start: 2020-07-08 — End: ?
  Administered 2020-07-09: 02:00:00 1 via ORAL

## 2020-07-08 MED ORDER — DOCUSATE SODIUM 100 MG PO CAP
100 mg | Freq: Two times a day (BID) | ORAL | 0 refills | Status: AC
Start: 2020-07-08 — End: ?
  Administered 2020-07-09: 02:00:00 100 mg via ORAL

## 2020-07-08 MED ORDER — HYDRALAZINE 20 MG/ML IJ SOLN
10 mg | INTRAVENOUS | 0 refills | Status: AC | PRN
Start: 2020-07-08 — End: ?
  Administered 2020-07-09: 03:00:00 10 mg via INTRAVENOUS

## 2020-07-08 MED ORDER — POTASSIUM CHLORIDE 20 MEQ PO TBTQ
40-60 meq | ORAL | 0 refills | Status: AC | PRN
Start: 2020-07-08 — End: ?
  Administered 2020-07-09: 03:00:00 60 meq via ORAL

## 2020-07-08 MED ORDER — LABETALOL 5 MG/ML IV SYRG
10 mg | INTRAVENOUS | 0 refills | Status: AC | PRN
Start: 2020-07-08 — End: ?

## 2020-07-08 MED ORDER — MAGNESIUM SULFATE IN D5W 1 GRAM/100 ML IV PGBK
1 g | INTRAVENOUS | 0 refills | Status: AC | PRN
Start: 2020-07-08 — End: ?
  Administered 2020-07-09: 03:00:00 1 g via INTRAVENOUS

## 2020-07-08 MED ORDER — PANTOPRAZOLE 40 MG PO TBEC
40 mg | Freq: Every day | ORAL | 0 refills | Status: AC
Start: 2020-07-08 — End: ?
  Administered 2020-07-09: 03:00:00 40 mg via ORAL

## 2020-07-08 MED ORDER — OXYCODONE 5 MG PO TAB
5-10 mg | ORAL | 0 refills | Status: AC | PRN
Start: 2020-07-08 — End: ?
  Administered 2020-07-09 (×2): 10 mg via ORAL

## 2020-07-08 MED ORDER — LEVETIRACETAM 500 MG/5 ML IV SOLN
500 mg | Freq: Two times a day (BID) | INTRAVENOUS | 0 refills | Status: DC
Start: 2020-07-08 — End: 2020-07-09
  Administered 2020-07-09: 02:00:00 500 mg via INTRAVENOUS

## 2020-07-08 MED ORDER — LEVOTHYROXINE 50 MCG PO TAB
50 ug | Freq: Every day | ORAL | 0 refills | Status: AC
Start: 2020-07-08 — End: ?
  Administered 2020-07-09: 11:00:00 50 ug via ORAL

## 2020-07-08 MED ORDER — GABAPENTIN 300 MG PO CAP
300 mg | ORAL | 0 refills | Status: AC
Start: 2020-07-08 — End: ?
  Administered 2020-07-09 (×2): 300 mg via ORAL

## 2020-07-08 MED ORDER — ONDANSETRON HCL (PF) 4 MG/2 ML IJ SOLN
4 mg | INTRAVENOUS | 0 refills | Status: AC | PRN
Start: 2020-07-08 — End: ?
  Administered 2020-07-09: 08:00:00 4 mg via INTRAVENOUS

## 2020-07-08 MED ORDER — POTASSIUM CHLORIDE 20 MEQ/15 ML PO LIQD
40-60 meq | NASOGASTRIC | 0 refills | Status: AC | PRN
Start: 2020-07-08 — End: ?

## 2020-07-08 MED ORDER — ACETAMINOPHEN 325 MG PO TAB
650 mg | ORAL | 0 refills | Status: AC | PRN
Start: 2020-07-08 — End: ?
  Administered 2020-07-09 (×2): 650 mg via ORAL

## 2020-07-08 MED ORDER — ROSUVASTATIN 5 MG PO TAB
5 mg | ORAL | 0 refills | Status: AC
Start: 2020-07-08 — End: ?

## 2020-07-08 NOTE — Consults
Neuro Critical Care Consult       Tracy Mcmillan  Admission Date: 07/08/2020  LOS: 0 days  Full Code                      ASSESSMENT/PLAN     Patient Active Problem List    Diagnosis Date Noted   ? SDH (subdural hematoma) (HCC) 07/08/2020   ? Brain compression (HCC) 07/08/2020   ? Adult hypothyroidism    ? Barrett's esophagus    ? Current every day smoker    ? Mixed hyperlipidemia    ? Chronic cluster headache    ? COPD (chronic obstructive pulmonary disease) (HCC)    ? 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 Vitamin D deficiency    ? OSA (obstructive sleep apnea)        Tracy Mcmillan is a 75 y.o. male with a PMH of smoking, COPD, OSA, Barretts esophagus, HTN, HLD, cluster headaches, hypothyroidism, and PTSD On 4/27 he presented to the [facility] ER with complaints of new onset headache which started the day before. His headache was treated with toradol, benadryl and compazine and he was sent home. On 4/28 while at an ENT appointment, they suggested a CT be obtained which demonstrated a left SDH with mass effect and a midline shift. 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 He was transferred to [facility] for management and treatment.     Hospital and ICU course:   4/28: Transferred to [place]. Marland KitchenSTAT CT head, initiate keppra BID. Admit labs and workup.    Neuro:   Acute left SDH  - Associated mass effect with brain compression  7 mm Acomm Aneurysm  HO cluster headaches  - STAT CT head WO contrast upon arrival  - Initiate keppra 500mg  BID for seizure prophylaxis  - Neuro-ICU monitoring, neurochecks  Q1hr  - PT/OT    4/28 OSH CT neck w/ contrast:  2 small stable nodules on the left parotid gland  Upper lungs with extensive emphysema  Mild calcification of the carotid bulbs without stenosis; ICAs and vertebral arteries are patent  Multilevel degenerative disc disease  C3-4, C4-5 and C5-6 foraminal stenosis    Sedation/Pain Management:   Headache  PTSD  - PRN tylenol, oxycodone and fentanyl  - Assess for delirium daily  - Resume PTA Gabapentin 300mg  TID  - Hold PTA Imitrex 50mg  PRN    Cardiac:   HTN  HLD  EKG upon arrival: in process  Trop in process  FLP in process  - Continuous telemetry monitoring  - VS Q1hr minimal  - SBP goal < 140  - MAP goal > 65  - PRN labetalol & hydralazine  - Consider 2D echo this hospitalization  Resume PTA medications:  - Zestril 20-25mg  QD  - Rosuvastatin three times per week    Respiratory:    Current every day smoker; 1 PPD x57 years  COPD  OSA  Seasonal allergies  CXR on arrival  - Stable on room air  - PaO2 goal >100, Spo2 goal >95%  - Encouratge IS Q1hr while awake  Resume PTA loratadine      GI:  Barretts esophagus  LFP in process  - Feeding: NPO pending NSG plan  - PRN ondansetron  - Replace PTA omeprazole with pantoprazole 40mg  QD while inpatient  - Neurosurgery bowel regimen, ensure daily BM    Heme:   Hgb 14.5   Hct 43.9   Plts 182  INR, aPTT and Protime in process  - Daily CBCD  - Hold PTA anacin and all other anticoagulants/antiplatelets at this time  - SCDs for VTE PPX  - Assess for coagulopathy, maintain platelets above 100k, INR <1.5    ID:   Acute leukocytosis  WBC 13.4  - Afebrile on arrival  - Aim for normothermia, Temp <38.3 celsius, normothermia protocol if febrile  - Consider additional ID workup if indicated    Renal:   BUN/ Cr in process  - Trend renal function on daily BMP  - Accurate hourly I/O balance   - Aim for normovolemia    Endocrine:    Hypothyroidism  Blood glucose in process  - TSH with T4 reflex ordered for morning labs  - Resume PTA synthroid QD  - Trend blood glucose on daily BMP and PRN  - Blood glucose goal 100-180mg /dl    FEN:   CMP, Mg, and Phos in process  iCal 0.99  - Daily BMP, Mg, Phos and iCal  - IVF: NS @ 58mL/hr x12 hours while patient is NPO  - Magnesium goal >2.0, i-Cal goal > 1.0, Potassium goal >4.0 mEq/L  - Implement Critical Care Electrolyte Replacement Protocol pending renal function    Prophylaxis Review:   A)GI: PPI/H2Blocker   B) Lines:  No  C) Urinary Catheter:  No  D) Antibiotic Usage:  No  E) VTE:  Pharmacological prophylaxis; Contraindication:  Active bleeding; Cerebral hemorrhage  and Mechanical prophylaxis; Sequential compression device  F) Isolation: n/a  G)Seizures: see aboive  I) Restraints: Patient assessed for need for restraints.   Disposition/Family:   per primary team    Primary service: Neurosurgery    Consults:  Neuro Critical Care    ___________________________________________________________________  SUBJECTIVE   Chief Complaint:  headache    History of Present Illness: Tracy Mcmillan is a 75 y.o. male transferred from the [place] without complications en route    Medical History:   Diagnosis Date   ? Adult hypothyroidism    ? Barrett's esophagus    ? Cervicalgia    ? Chronic cluster headache    ? 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 COPD (chronic obstructive pulmonary disease) (HCC)    ? Current every day smoker    ? Dependence on supplemental oxygen    ? Enlarged prostate    ? GERD with stricture    ? Impaired fasting glucose    ? Marijuana use in remission    ? Mixed hyperlipidemia    ? OSA (obstructive sleep apnea)     severe   ? Vitamin D deficiency        No past surgical history on file.    Family history reviewed; non-contributory    Social History     Social History Narrative    Tracy Mcmillan is right handed.    He lives at home alone independently.    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       Code Status: Full Code    Decision Maker: self     Immunizations (includes history and patient reported):   There is no immunization history on file for this patient.        Allergies:  Ciprofloxacin, Demerol [meperidine], and Penicillin    Medications Prior to Admission   Medication Sig   ? Aspirin-Caffeine (ANACIN) 400-32 mg tab Take  by mouth.   ? cholecalciferol (vitamin D3) (D3-2000 PO) Take  by mouth.   ?  Epinephrine Base 0.3 mg/0.3 mL syrg Inject  to area(s) as directed. Indications: a significant type of allergic reaction called anaphylaxis, bee venom allergy   ? gabapentin (NEURONTIN) 300 mg capsule Take 300 mg by mouth every 8 hours.   ? ibuprofen (MOTRIN) 600 mg tablet Take 600 mg by mouth every 8 hours as needed for Pain. Take with food.   ? levothyroxine (SYNTHROID) 50 mcg tablet Take 50 mcg by mouth daily 30 minutes before breakfast.   ? lisinopriL-hydrochlorothiazide (ZESTORETIC) 20-25 mg tablet Take 1 tablet by mouth daily.   ? loratadine (CLARITIN) 10 mg tablet Take 10 mg by mouth every morning.   ? omeprazole DR (PRILOSEC) 40 mg capsule Take 40 mg by mouth daily before breakfast.   ? prochlorperazine maleate (COMPAZINE) 10 mg tablet Take 10 mg by mouth every 6 hours.   ? rosuvastatin (CRESTOR) 5 mg tablet Take 5 mg by mouth three times weekly.   ? SUMAtriptan succinate (IMITREX) 50 mg tablet Take 50 mg by mouth once. Take one tablet by mouth at onset of headache. May repeat after 2 hours if needed. Max of 200 mg in 24 hours.       Review of Systems:  All other systems reviewed and are negative.      OBJECTIVE                     Vital Signs: Last Filed                  Vital Signs: 24 Hour Range   BP: 145/103 (04/28 2047)  Temp: 36.8 ?C (98.2 ?F) (04/28 2047)  Pulse: 75 (04/28 2047)  Respirations: 12 PER MINUTE (04/28 2047)  SpO2: 95 % (04/28 2047)  O2 Device: None (Room air) (04/28 2047)  Height: 177.8 cm (5' 10) (04/28 2047)  Weight: 87.4 kg (192 lb 10.9 oz) (04/28 2047)  BP: (145-161)/(98-103)   Temp:  [36.8 ?C (98.2 ?F)]   Pulse:  [75-79]   Respirations:  [12 PER MINUTE-19 PER MINUTE]   SpO2:  [95 %]   O2 Device: None (Room air)    Intensity Pain Scale (Self Report): (not recorded) Vitals:    07/08/20 2047   Weight: 87.4 kg (192 lb 10.9 oz)         Artificial airway:  None              Ventilator/ Respiratory Therapy:  No   Vent weaning trial:  Not applicable    Lines:  Peripheral Line  Drains: None    Critical Care Vitals:      ICP Monitoring: Hemodynamics/Oxycalcs:       Intake/Output Summary:  (Last 24 hours)    Intake/Output Summary (Last 24 hours) at 07/08/2020 2140  Last data filed at 07/08/2020 2100  Gross per 24 hour   Intake ?   Output 100 ml   Net -100 ml            Physical Exam:    Blood pressure (!) 145/103, pulse 75, temperature 36.8 ?C (98.2 ?F), height 177.8 cm (5' 10), weight 87.4 kg (192 lb 10.9 oz), SpO2 95 %.    Glasgow coma score:              E: 4 - Opens eyes on own          M: 6 - Follows simple motor commands              V: 5 - Alert  and oriented    Neuro:   Mental Status: Briskly alert and oriented   Cranial Nerves: Cranial nerves 2-12 INTACT by inspection.      - Pupil exam: Size: 3               Reactivity: brisk    - EOM: intact    Motor:         RUE: Strength: 5/5                     RLE: Strength: 5/5            LUE: Strength: 5/5         LLE: Strength: 5/5   Sensory: normal   Coordination: intact   DTRs: intact   Gait: unstable transferring from gorney to bed    Lungs: diminished breath sounds all lung fields                     Pulmonary:  Respiratory status:   Stable    Heart: S1, S2 normal  Abdomen: abnormal findings:  distended  Extremities: no edema, redness or tenderness in the calves or thighs  Skin: Skin color, texture, turgor normal. No rashes or lesions    Point of Care Testing:  (Last 24 hours):       Lab Review:  Pertinent labs reviewed from the OSH and admission labs are resulting    Radiology and Other Diagnostic Procedures Review:  Pertinent radiologic and diagnostic procedures reviewed.      I spent 70 minutes managing the care of this patient. Cares included: detailed neurologic and systems exam, medication review, laboratory data review and interpretation, electrolyte management, review of available imaging, DVT/PE prophylaxis review, diet review, activity review, mechanical ventilation and sedation management, and coordination of care with consulted teams    Everett Graff, APRN-NP Date:  07/08/[phone]

## 2020-07-08 NOTE — Progress Notes
75 yo male pt presented to the ER with c/o headache yesterday.  Pt was treated and sent home.  Today pt was evaluated by ENT and a CT head and neck was done and pt was found to have a 1 cm lt sided SDH with mass effect and a 1/2 cm midline shift.  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  Pt continues to c/o mild headache.  No other neurological symptoms noted.  Pt is alert and oriented x 3.  No reported trauma to the head.  PMH - smoking, Barretts esophagus, HTN, Migraines, HLD.  Sending MD reports no Neurosurgical services at sending facility and admitting MDs are not comfortable admitting the pt without NS back up.  VSS

## 2020-07-09 ENCOUNTER — Encounter: Admit: 2020-07-09 | Discharge: 2020-07-09 | Payer: PRIVATE HEALTH INSURANCE

## 2020-07-09 ENCOUNTER — Inpatient Hospital Stay: Admit: 2020-07-09

## 2020-07-09 MED FILL — OXYCODONE 5 MG PO TAB: 5 mg | ORAL | 4 days supply | Qty: 20 | Fill #1 | Status: CP

## 2020-08-06 ENCOUNTER — Encounter: Admit: 2020-08-06 | Discharge: 2020-08-06 | Payer: No Typology Code available for payment source

## 2020-08-07 ENCOUNTER — Encounter: Admit: 2020-08-07 | Discharge: 2020-08-07 | Payer: No Typology Code available for payment source

## 2020-09-10 IMAGING — CT CT BRAIN WO CONTRAST
3 of 4 series · 14 of 47 positions shown, 16 images · non-contrast
Comparison: None

HISTORY: 74 year-old male with aphasia.
TECHNIQUE: CT of the head was obtained without contrast. 

Dose reduction technique used: Automated exposure control and adjustment of the mA and/or kV according to patient size. CT count in previous 12-months: 0
Total radiation dose to patient is CTDIvol 37.50 mGy and DLP 791.00 mGy-cm.

[Series 2: head w/o soft tissue · axial · non-contrast · 0.40mm/px · z∈[-616,-466]mm · 8 of 126 slices shown, 10 images]
[im 13/126  brain]
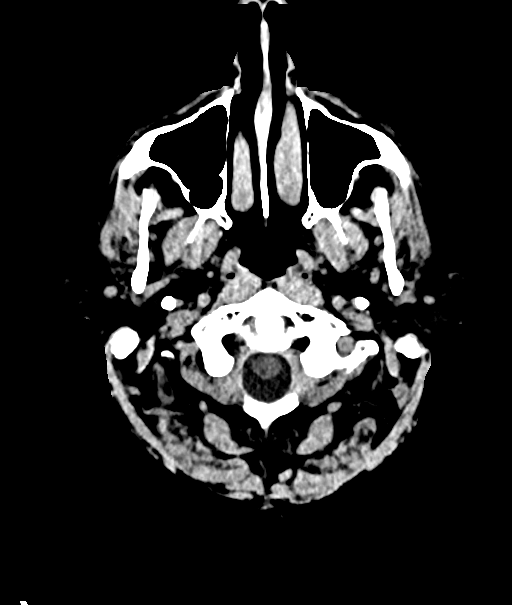
[im 13/126  bone]
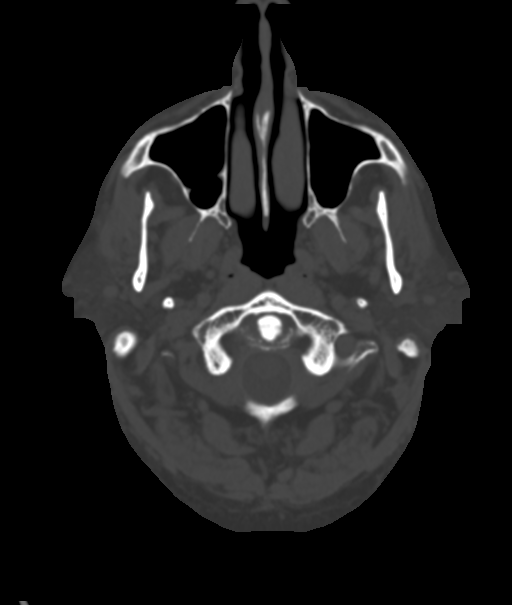
[im 26/126  brain]
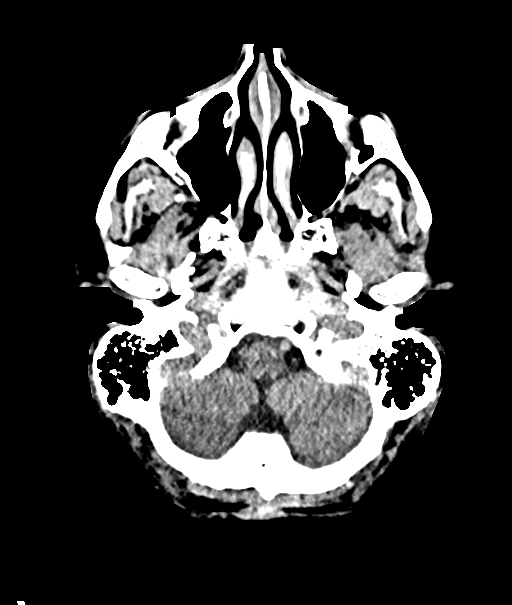
[im 38/126  brain]
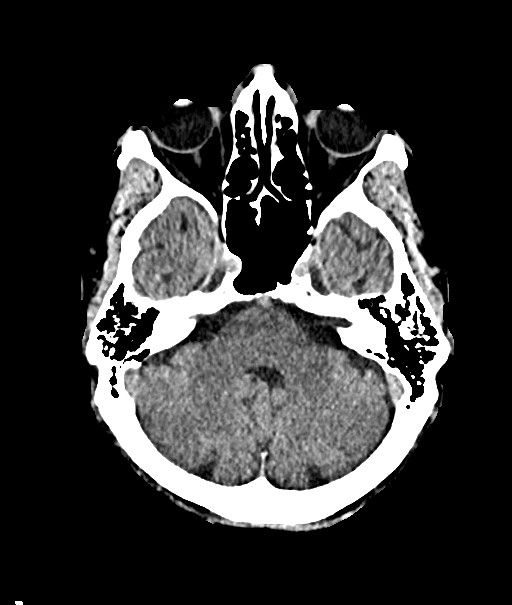
[im 57/126  brain]
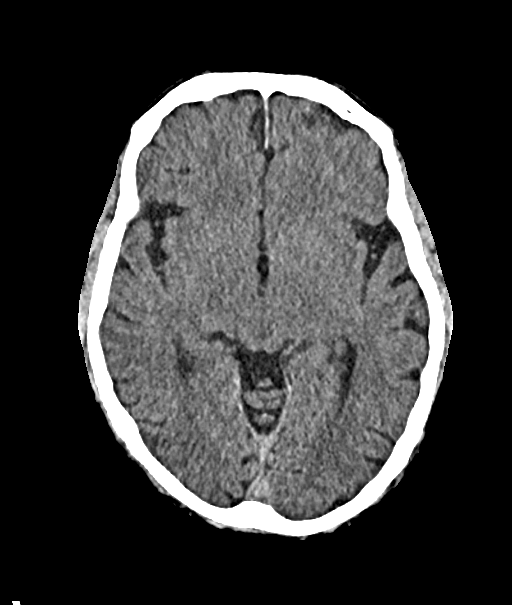
[im 69/126  brain]
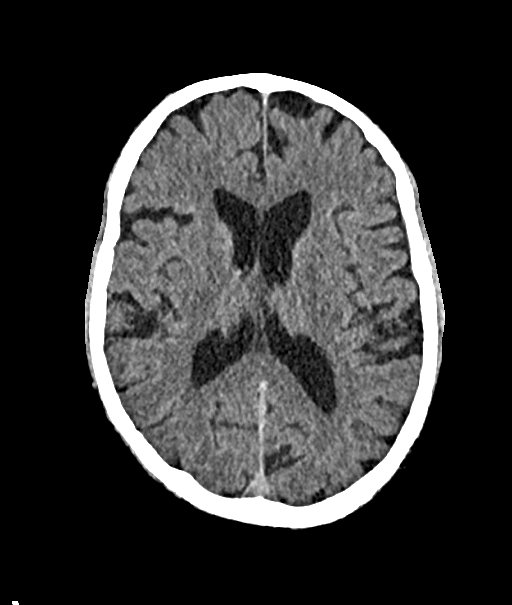
[im 69/126  bone]
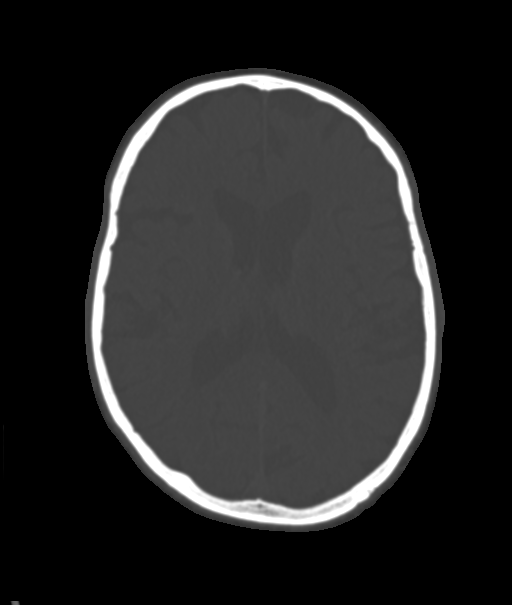
[im 88/126  brain]
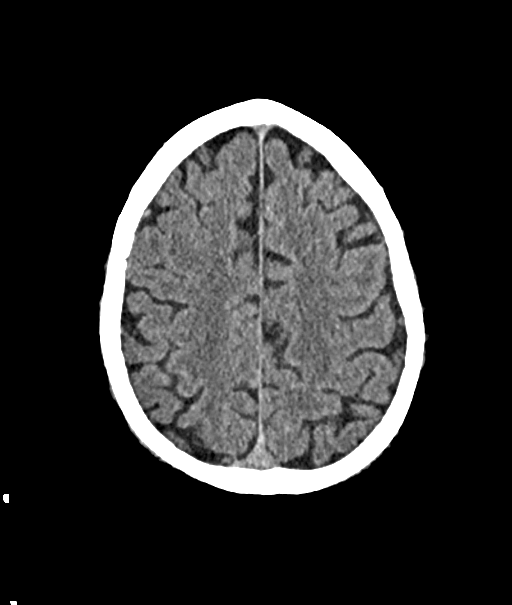
[im 101/126  brain]
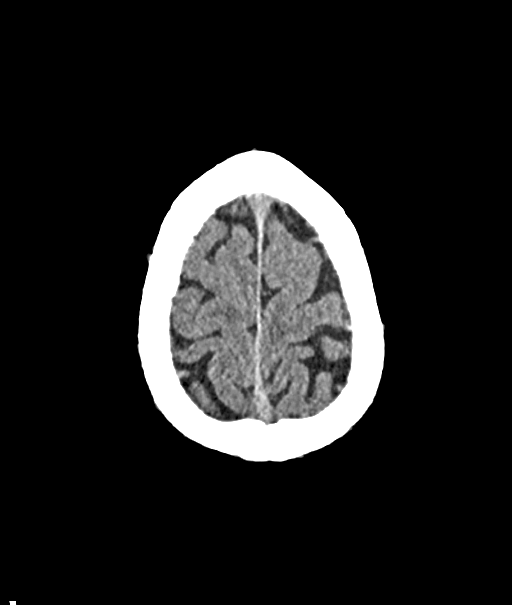
[im 113/126  brain]
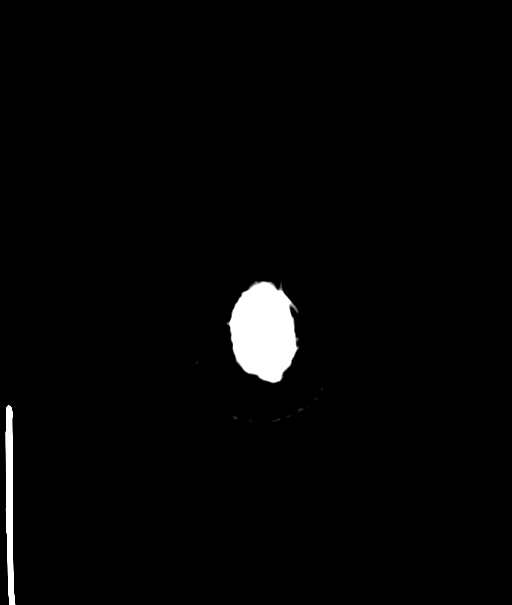

[Series 8: cor · coronal · 0.37mm/px · 3 of 67 slices shown]
[im 23/67  brain]
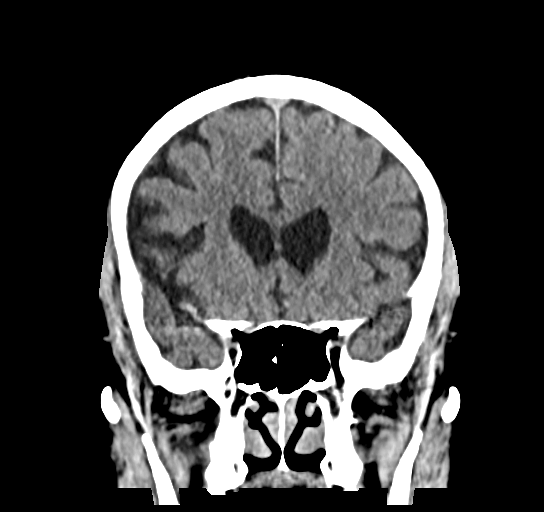
[im 30/67  brain]
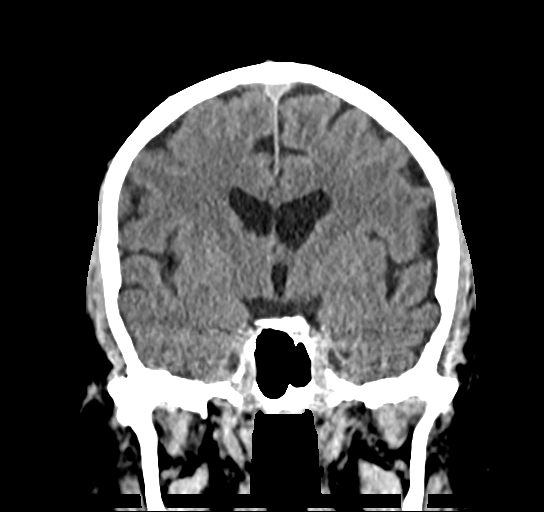
[im 37/67  brain]
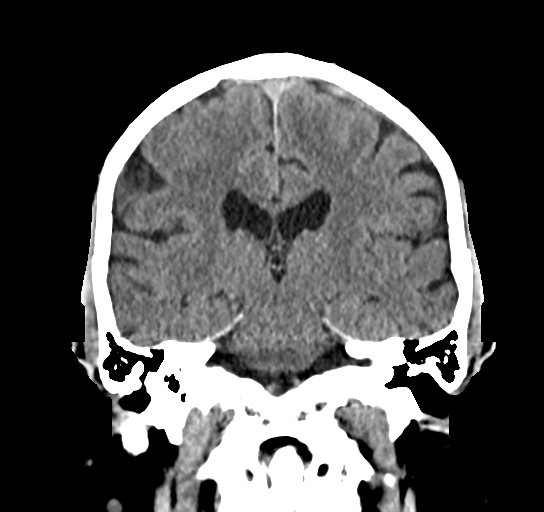

[Series 10: sag · sagittal · 0.37mm/px · 3 of 54 slices shown]
[im 18/54  brain]
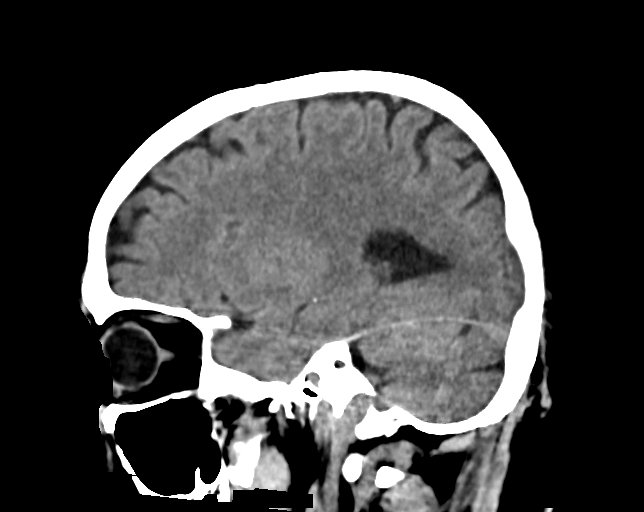
[im 27/54  brain]
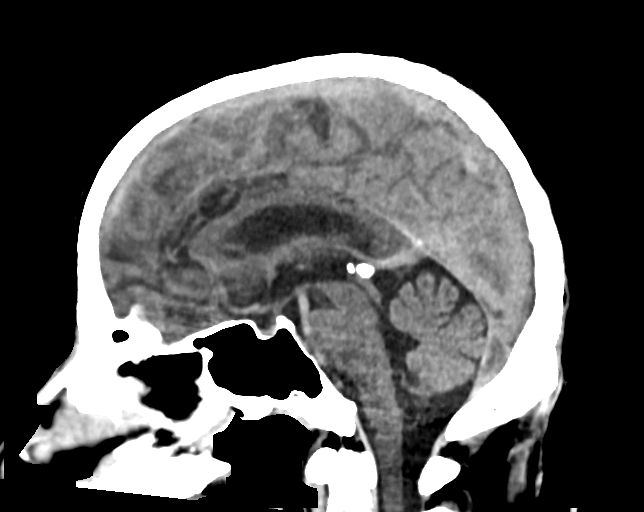
[im 36/54  brain]
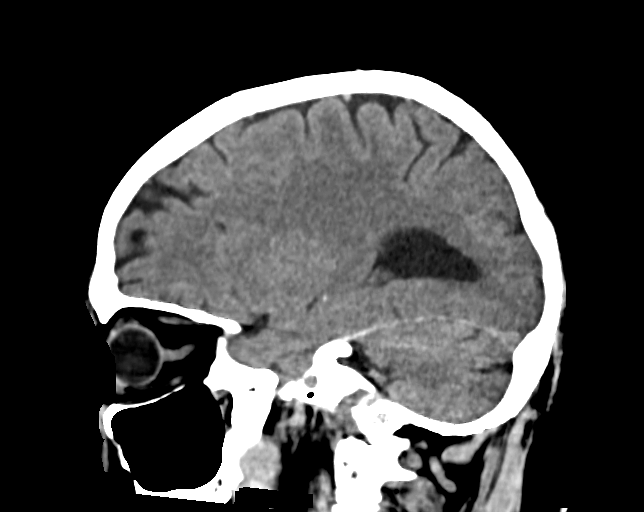

[14 of 47 positions shown; findings below may reference images not displayed]

FINDINGS: BRAIN PARENCHYMA: There is normal differentiation of the gray-white matter.

HEMORRHAGE: No acute intracranial hemorrhagic products.

EXTRA-AXIAL SPACES: No intra- or extra-axial fluid collections. Basilar cisterns are maintained.

VENTRICULAR SYSTEM: No hydrocephalus, midline shift or herniation.

BONES: The calvarium is intact.  Visualized paranasal sinuses and mastoid air cells are clear.
IMPRESSION: No acute intracranial abnormality.

## 2020-09-17 ENCOUNTER — Encounter: Admit: 2020-09-17 | Discharge: 2020-09-17 | Payer: No Typology Code available for payment source

## 2020-09-17 ENCOUNTER — Ambulatory Visit: Admit: 2020-09-17 | Discharge: 2020-09-18 | Payer: No Typology Code available for payment source

## 2020-09-17 DIAGNOSIS — E039 Hypothyroidism, unspecified: Secondary | ICD-10-CM

## 2020-09-17 DIAGNOSIS — I671 Cerebral aneurysm, nonruptured: Secondary | ICD-10-CM

## 2020-09-17 DIAGNOSIS — M542 Cervicalgia: Secondary | ICD-10-CM

## 2020-09-17 DIAGNOSIS — Z87898 Personal history of other specified conditions: Secondary | ICD-10-CM

## 2020-09-17 DIAGNOSIS — Z9981 Dependence on supplemental oxygen: Secondary | ICD-10-CM

## 2020-09-17 DIAGNOSIS — J449 Chronic obstructive pulmonary disease, unspecified: Secondary | ICD-10-CM

## 2020-09-17 DIAGNOSIS — K2270 Barrett's esophagus without dysplasia: Secondary | ICD-10-CM

## 2020-09-17 DIAGNOSIS — E782 Mixed hyperlipidemia: Secondary | ICD-10-CM

## 2020-09-17 DIAGNOSIS — I10 Essential (primary) hypertension: Secondary | ICD-10-CM

## 2020-09-17 DIAGNOSIS — G4733 Obstructive sleep apnea (adult) (pediatric): Secondary | ICD-10-CM

## 2020-09-17 DIAGNOSIS — N400 Enlarged prostate without lower urinary tract symptoms: Secondary | ICD-10-CM

## 2020-09-17 DIAGNOSIS — F32A Depression: Secondary | ICD-10-CM

## 2020-09-17 DIAGNOSIS — F4312 Post-traumatic stress disorder, chronic: Secondary | ICD-10-CM

## 2020-09-17 DIAGNOSIS — K219 Gastro-esophageal reflux disease without esophagitis: Secondary | ICD-10-CM

## 2020-09-17 DIAGNOSIS — T8859XA Other complications of anesthesia, initial encounter: Secondary | ICD-10-CM

## 2020-09-17 DIAGNOSIS — G44029 Chronic cluster headache, not intractable: Secondary | ICD-10-CM

## 2020-09-17 DIAGNOSIS — F17200 Nicotine dependence, unspecified, uncomplicated: Secondary | ICD-10-CM

## 2020-09-17 DIAGNOSIS — E559 Vitamin D deficiency, unspecified: Secondary | ICD-10-CM

## 2020-09-17 DIAGNOSIS — S065X9A Traumatic subdural hemorrhage with loss of consciousness of unspecified duration, initial encounter: Secondary | ICD-10-CM

## 2020-09-17 DIAGNOSIS — M1990 Unspecified osteoarthritis, unspecified site: Secondary | ICD-10-CM

## 2020-09-17 DIAGNOSIS — R7301 Impaired fasting glucose: Secondary | ICD-10-CM

## 2020-09-17 NOTE — Patient Instructions
It was great to see you today! Doctor Vonita Moss would like to follow up with you in 1 month with a CT of the head. Scheduling will contact you to make an appointment. If you have any questions, please contact our office at [phone].    Thank you,  Lorra Hals, RN  [phone]

## 2020-09-17 NOTE — Progress Notes
Date of Service: 09/17/2020    Subjective:             Tracy Mcmillan is a 75 y.o. 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  He was initially evaluated in April by Dr. Sena Hitch.    History of Present Illness    09/17/2020  Tracy Mcmillan is a 75 year old male who was evaluated initially on July 08, 2020 when he was transferred to [facility] for evaluation of a left-sided subdural hematoma.  He does have a history of chronic migraines but was having different headaches at that time.  He did not have any preceding trauma.  He overall does not take any anticoagulation.  For his headaches however he was taking aspirin regularly.    Upon arrival he had CT scans performed as well as MRI and there were concerns for the left-sided increased fluid space in the subdural region.  There was no evidence of acute blood.  This may be related to a chronic subdural hematoma versus volume loss in the frontal lobes with increased subdural space.    Overall the patient is clinically been stable without any headaches for the last few months.  He was referred to my clinic and it initially appeared that the evaluation was for subdural hematoma.  However, in further review of the charts and imaging the patient did have an MRA performed that was transferred here which does show evidence of a approximately 6 mm anterior communicating artery region aneurysm.  This was not known at the time of the visit and the patient was unsure of any other reasons for follow-up for this appointment.    The plan at this point is to get a CT head in approximately 1 month and revisit given the time since the initial evaluation for potential chronic subdural hematoma.  This may actually be advantageous to get the CT scan before any discussion about angiogram or further evaluation of the aneurysm given that if there is evidence of growth or changes of the left subdural space concerning for chronic subdural hematoma this may be amenable to endovascular treatment with middle meningeal artery embolization.  It would be better to have that information prior to any discussion of the aneurysm which overall is small in size and carries a low risk for any rupture.    Patient is a current smoker.       Review of Systems  Negative except HPI    Objective:         ? cholecalciferol (vitamin D3) (D3-2000 PO) Take  by mouth.   ? Epinephrine Base 0.3 mg/0.3 mL syrg Inject  to area(s) as directed. Indications: a significant type of allergic reaction called anaphylaxis, bee venom allergy   ? gabapentin (NEURONTIN) 300 mg capsule Take 300 mg by mouth three times daily.   ? levothyroxine (SYNTHROID) 50 mcg tablet Take 50 mcg by mouth daily 30 minutes before breakfast.   ? lisinopriL-hydrochlorothiazide (ZESTORETIC) 20-25 mg tablet Take 1 tablet by mouth daily.   ? loratadine (CLARITIN) 10 mg tablet Take 10 mg by mouth every morning.   ? omeprazole DR (PRILOSEC) 40 mg capsule Take 40 mg by mouth daily before breakfast.   ? rosuvastatin (CRESTOR) 5 mg tablet Take 5 mg by mouth three times weekly.   ? SUMAtriptan succinate (IMITREX) 50 mg tablet Take 50 mg by mouth once. Take one tablet by mouth at onset of headache. May repeat after 2 hours if needed. Max  of 200 mg in 24 hours.     Vitals:    09/17/20 1005   BP: (!) 161/106   BP Source: Arm, Left Upper   Pulse: 76   SpO2: 97%   PainSc: Zero   Weight: 88.5 kg (195 lb)   Height: 177.8 cm (5' 10)     Body mass index is 27.98 kg/m?Marland Kitchen     Physical Exam   Awake, alert, oriented  Cranial nerves intact  Strength full throughout  Sensation tact throughout  Gait normal without imbalance  No dysmetria    Encounter Diagnoses   Name Primary?   ? SDH (subdural hematoma) (HCC) Yes   ? Cerebral aneurysm             Imaging:    07/08/20 CT head      07/08/20 MRA head - acomm region aneurysm        Assessment and Plan:    Tracy Mcmillan is a 75 year old male who was initially seen for a subdural hematoma.  He was transferred to [facility] for evaluation of this.  In review of his charts and images from the [place] it was evident that he does have a MRA showing a anterior communicating artery aneurysm.  The patient was unsure of this at the time of the visit and the notes and imaging was reviewed after the patient had left the clinic.    Problem   Cerebral Aneurysm    left Subdural hematoma    1.  Left subdural fluid collection  - We discussed that overall this may be a chronic subdural hematoma versus just fluid on the outside of the brain from general volume loss within the frontal regions.  - We will plan for a follow-up CT scan of the head within the next month and follow-up.  This will help [place] evaluate whether or not the subdural space is changing.  If there is evidence of more of a's chronic subdural hematoma appearance there may be some benefit towards middle meningeal artery embolization.    2.  Anterior communicating artery aneurysm    - We will call the patient to discuss this finding as the information became available after he had left the clinic.    - We will continue plans for follow-up CT scan of the head.  If the patient needs future middle meningeal artery embolization we would then also evaluate the aneurysm at the same time.  If he does not need middle meningeal artery embolization we would a discussed perform a diagnostic cerebral angiogram for better characterization of the aneurysm.             ATTESTATION    Total time 22 minutes. Estimated counseling time 15 minutes. Counseled Patient and daughter regarding The subdural space enlargement with potential for chronic subdural hematoma and plans for further evaluation and following.  We will call the patient to discuss the finding of the aneurysm and plans for follow-up on this also..    Staff name:  Samantha Crimes, MD Date:  09/17/2020

## 2020-09-21 ENCOUNTER — Encounter: Admit: 2020-09-21 | Discharge: 2020-09-21 | Payer: No Typology Code available for payment source

## 2020-10-22 ENCOUNTER — Encounter: Admit: 2020-10-22 | Discharge: 2020-10-22 | Payer: No Typology Code available for payment source

## 2020-10-22 ENCOUNTER — Ambulatory Visit: Admit: 2020-10-22 | Discharge: 2020-10-22 | Payer: No Typology Code available for payment source

## 2020-10-22 DIAGNOSIS — M542 Cervicalgia: Secondary | ICD-10-CM

## 2020-10-22 DIAGNOSIS — F17200 Nicotine dependence, unspecified, uncomplicated: Secondary | ICD-10-CM

## 2020-10-22 DIAGNOSIS — K2270 Barrett's esophagus without dysplasia: Secondary | ICD-10-CM

## 2020-10-22 DIAGNOSIS — E559 Vitamin D deficiency, unspecified: Secondary | ICD-10-CM

## 2020-10-22 DIAGNOSIS — Z9981 Dependence on supplemental oxygen: Secondary | ICD-10-CM

## 2020-10-22 DIAGNOSIS — N400 Enlarged prostate without lower urinary tract symptoms: Secondary | ICD-10-CM

## 2020-10-22 DIAGNOSIS — F32A Depression: Secondary | ICD-10-CM

## 2020-10-22 DIAGNOSIS — G4733 Obstructive sleep apnea (adult) (pediatric): Secondary | ICD-10-CM

## 2020-10-22 DIAGNOSIS — E039 Hypothyroidism, unspecified: Secondary | ICD-10-CM

## 2020-10-22 DIAGNOSIS — S065X9A Traumatic subdural hemorrhage with loss of consciousness of unspecified duration, initial encounter: Secondary | ICD-10-CM

## 2020-10-22 DIAGNOSIS — I671 Cerebral aneurysm, nonruptured: Secondary | ICD-10-CM

## 2020-10-22 DIAGNOSIS — M1990 Unspecified osteoarthritis, unspecified site: Secondary | ICD-10-CM

## 2020-10-22 DIAGNOSIS — E782 Mixed hyperlipidemia: Secondary | ICD-10-CM

## 2020-10-22 DIAGNOSIS — Z87898 Personal history of other specified conditions: Secondary | ICD-10-CM

## 2020-10-22 DIAGNOSIS — K219 Gastro-esophageal reflux disease without esophagitis: Secondary | ICD-10-CM

## 2020-10-22 DIAGNOSIS — J449 Chronic obstructive pulmonary disease, unspecified: Secondary | ICD-10-CM

## 2020-10-22 DIAGNOSIS — F4312 Post-traumatic stress disorder, chronic: Secondary | ICD-10-CM

## 2020-10-22 DIAGNOSIS — R7301 Impaired fasting glucose: Secondary | ICD-10-CM

## 2020-10-22 DIAGNOSIS — T8859XA Other complications of anesthesia, initial encounter: Secondary | ICD-10-CM

## 2020-10-22 DIAGNOSIS — I10 Essential (primary) hypertension: Secondary | ICD-10-CM

## 2020-10-22 DIAGNOSIS — G44029 Chronic cluster headache, not intractable: Secondary | ICD-10-CM

## 2020-10-27 ENCOUNTER — Encounter: Admit: 2020-10-27 | Discharge: 2020-10-27 | Payer: No Typology Code available for payment source

## 2020-11-23 IMAGING — MR MRI CSPINE WO CONTRAST
5 series · 48 of 48 positions shown · non-contrast
Comparison: Plain radiograph cervical spine study 07/02/2019.

HISTORY: 74 year-old male with cervicalgia. Status post surgery 12 years ago.
TECHNIQUE: MRI study of the cervical spine was performed with the sagittal and axial images in varying sequences.

[Series 1: bSSFP · axial · 6.0mm · 1.17mm/px · z∈[-66,+147]mm · 11 of 22 slices shown]
[im 1/22]
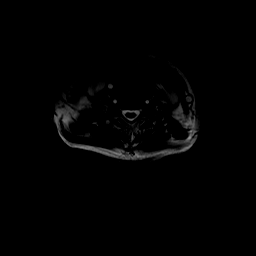
[im 3/22]
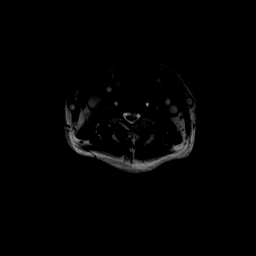
[im 5/22]
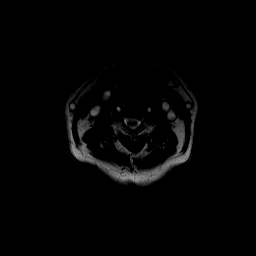
[im 7/22]
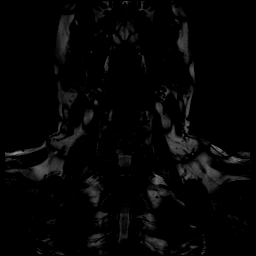
[im 9/22]
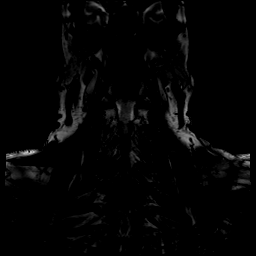
[im 11/22]
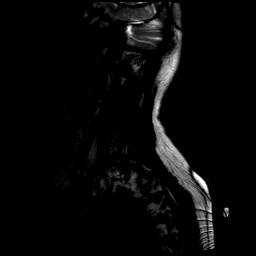
[im 13/22]
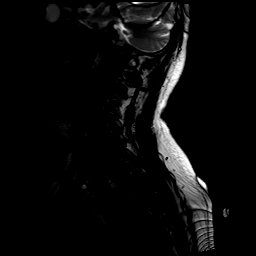
[im 15/22]
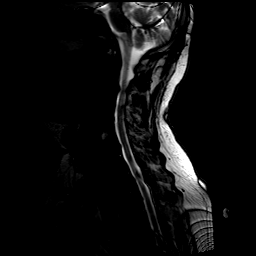
[im 17/22]
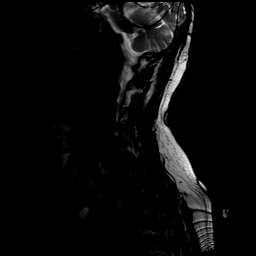
[im 19/22]
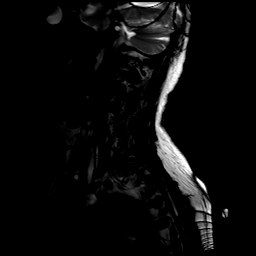
[im 22/22]
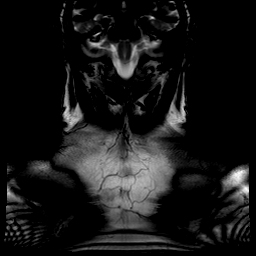

[Series 2: t2_sag · sagittal · 3.0mm · 0.69mm/px · 7 of 14 slices shown]
[im 1/14]
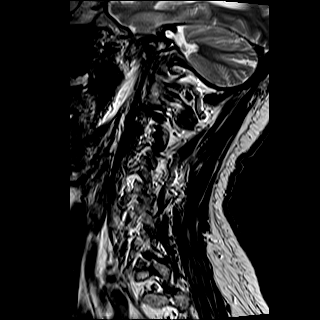
[im 3/14]
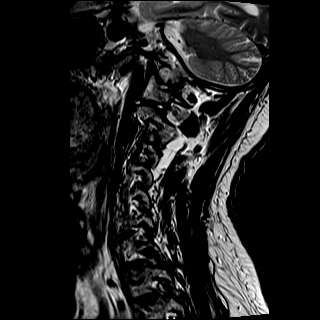
[im 5/14]
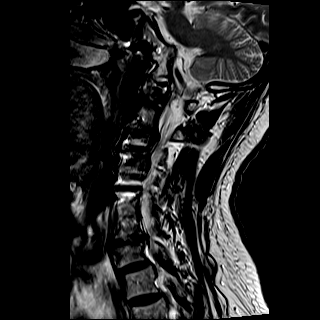
[im 7/14]
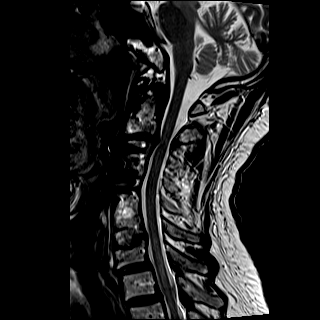
[im 9/14]
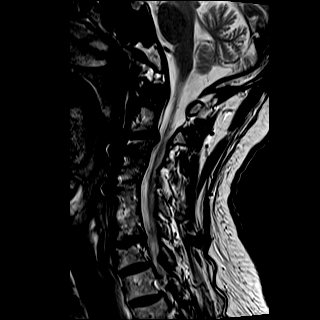
[im 11/14]
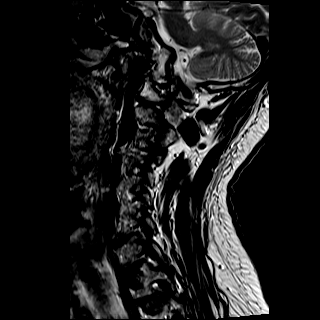
[im 14/14]
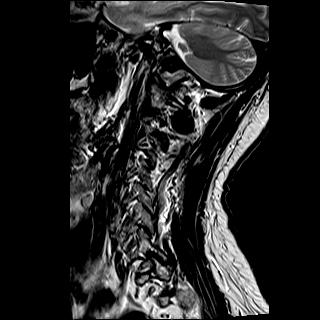

[Series 3: t1_sag · sagittal · 3.0mm · 0.69mm/px · 7 of 14 slices shown]
[im 1/14]
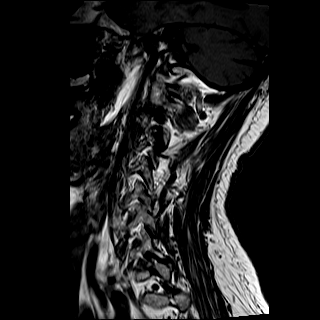
[im 3/14]
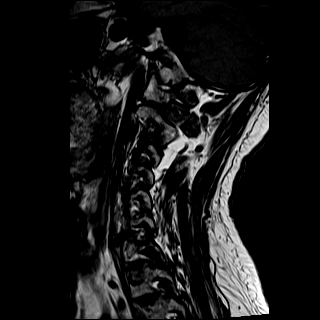
[im 5/14]
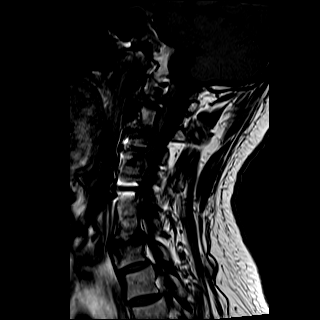
[im 7/14]
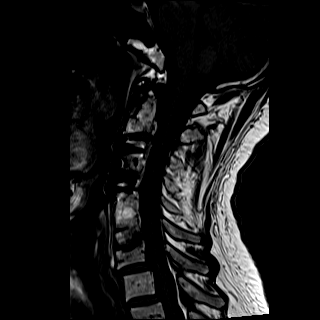
[im 9/14]
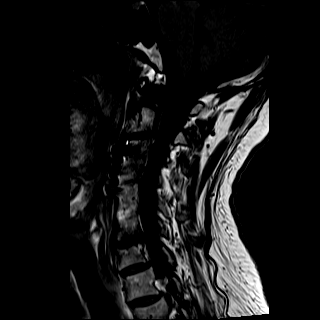
[im 11/14]
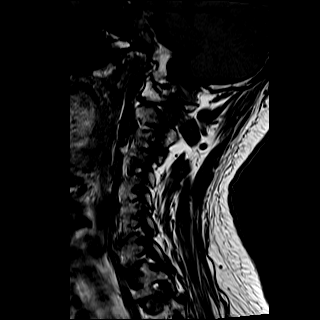
[im 14/14]
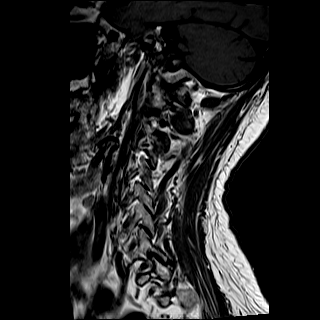

[Series 4: ir_sag · sagittal · 3.0mm · 0.86mm/px · 7 of 14 slices shown]
[im 1/14]
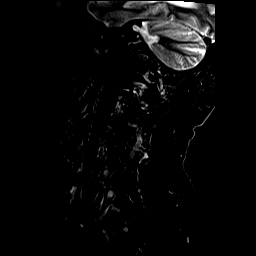
[im 3/14]
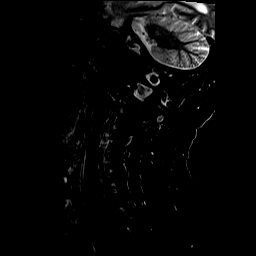
[im 5/14]
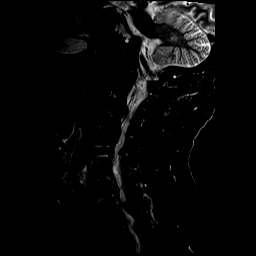
[im 7/14]
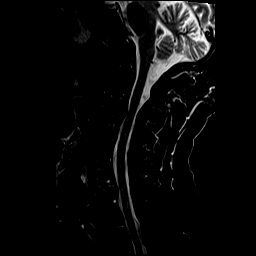
[im 9/14]
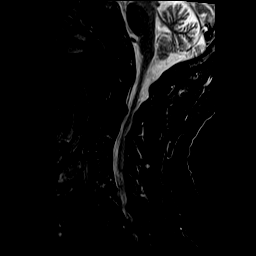
[im 11/14]
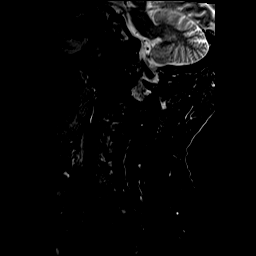
[im 14/14]
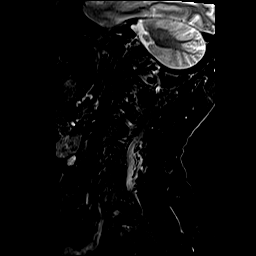

[Series 5: t2_axial · axial · 3.0mm · 0.56mm/px · z∈[-78,+42]mm · 16 of 32 slices shown]
[im 1/32]
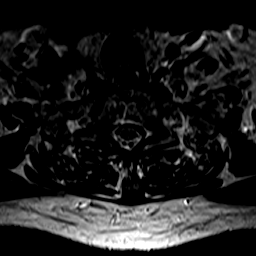
[im 3/32]
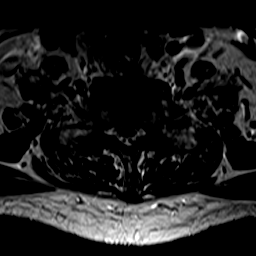
[im 5/32]
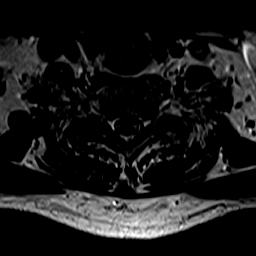
[im 7/32]
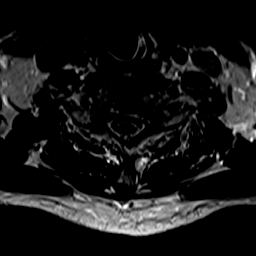
[im 9/32]
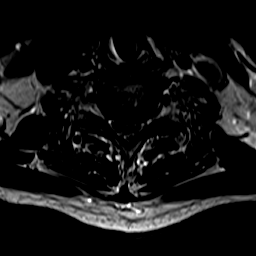
[im 11/32]
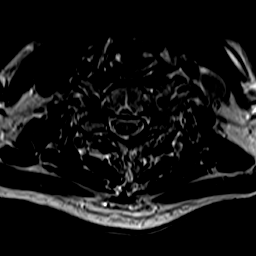
[im 13/32]
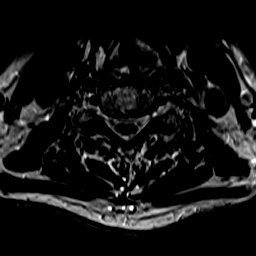
[im 15/32]
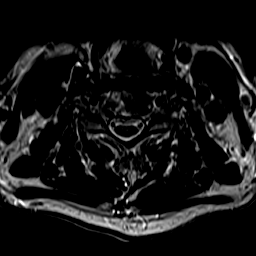
[im 17/32]
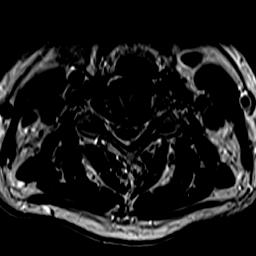
[im 19/32]
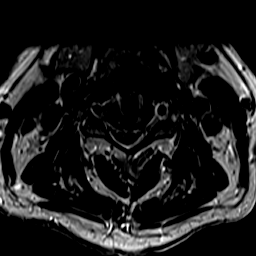
[im 21/32]
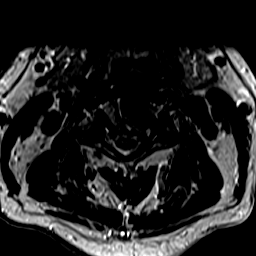
[im 23/32]
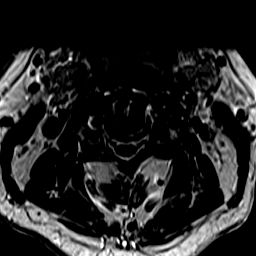
[im 25/32]
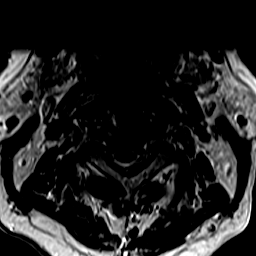
[im 27/32]
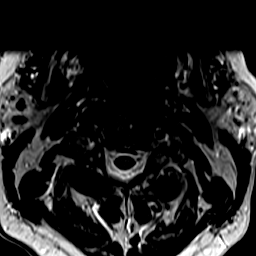
[im 29/32]
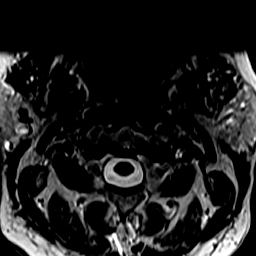
[im 32/32]
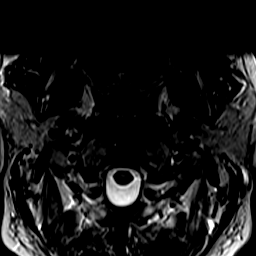

[48 of 48 positions shown; findings below may reference images not displayed]

FINDINGS: Vertebral body height and alignment: Status post fusion from C3 to C6 levels. Metallic hardware is identified at C3 and C4 levels. There is a minimal retrolisthesis C2 over C3 level. Minimal anterior spondylolisthesis C7 over T1 level.

The marrow signal: The visualized marrow signal is unremarkable.

Degenerative disc changes: Identified at C6-7 level.

Visualized portion of the posterior fossa and the cervical cord: Demonstrate normal signal.

C2-3 level:  Disc protrusion. Mild spinal canal stenosis. Both neural foramina are patent.

C3-4 level: No spinal canal stenosis. Neural foramina are patent.

C4-5 level: No spinal canal stenosis. Neural foramina are patent.

C5-6 level: No spinal canal stenosis. Neural foramina are patent.

C6-7 level: Disc bulge. Mild spinal canal stenosis. Moderate right neural foraminal narrowing. Left neural foramen is patent.

C7-T1 level: No spinal canal stenosis. Neural foramina are patent.
IMPRESSION: 1.
Mild C2-3 spinal canal stenosis.

2.
Mild C6-7 spinal canal stenosis and moderate right neural foraminal narrowing.

3.
Minimal retrolisthesis C2 over C3 level. Minimal anterior spondylolisthesis C7 over T1 level.

## 2020-11-28 ENCOUNTER — Encounter: Admit: 2020-11-28 | Discharge: 2020-11-28 | Payer: No Typology Code available for payment source

## 2020-12-10 IMAGING — CR C-SPINE 4 or 5 views
5 series · 5 of 5 positions shown · non-contrast
Comparison: 07/02/19

C-spine 5 views

INDICATIONS: Cervicalgia.

[w cervical spine lat]
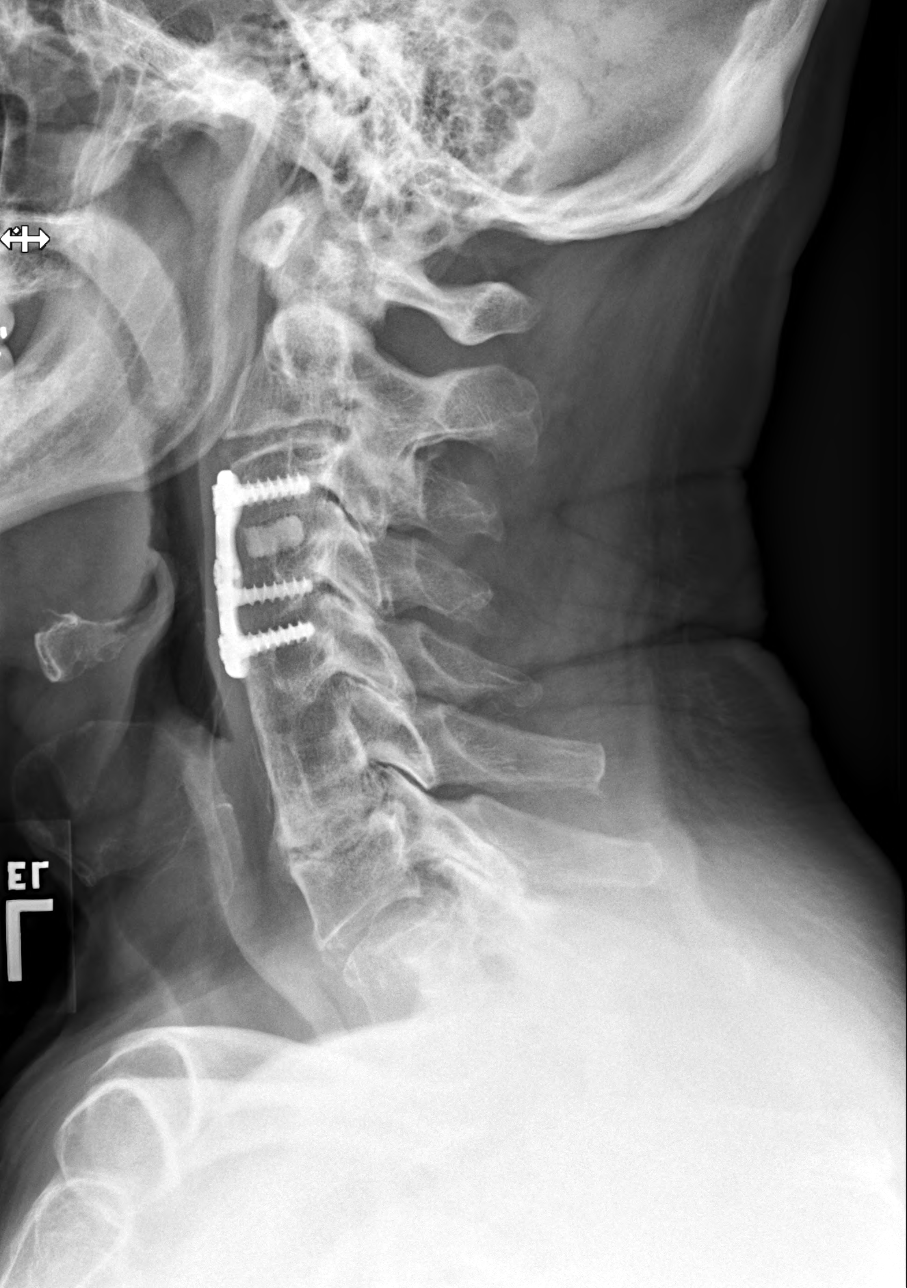

[w cervical spine odontoid]
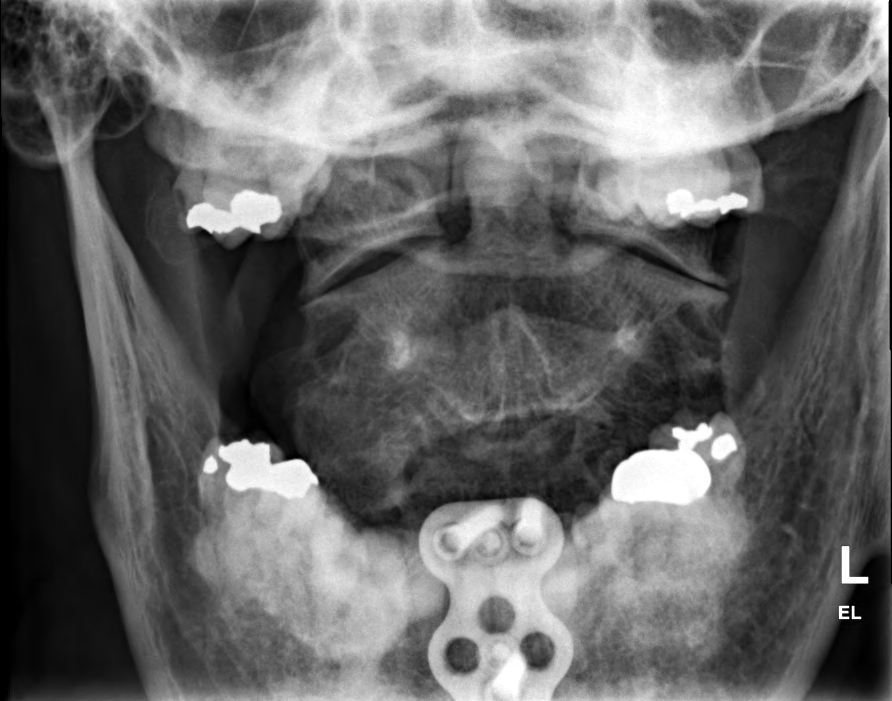

[w cervical spine ap]
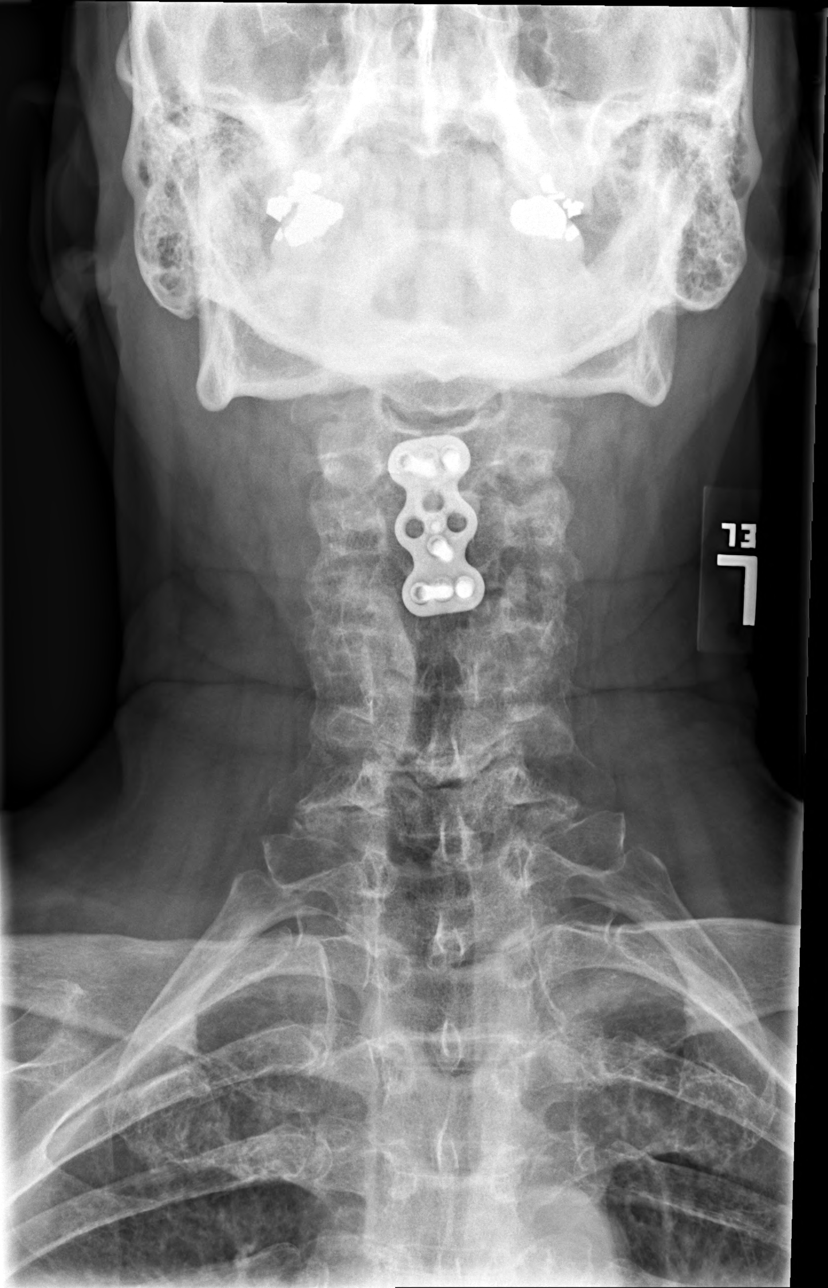

[w cervical spine ap_obl (1 of 2)]
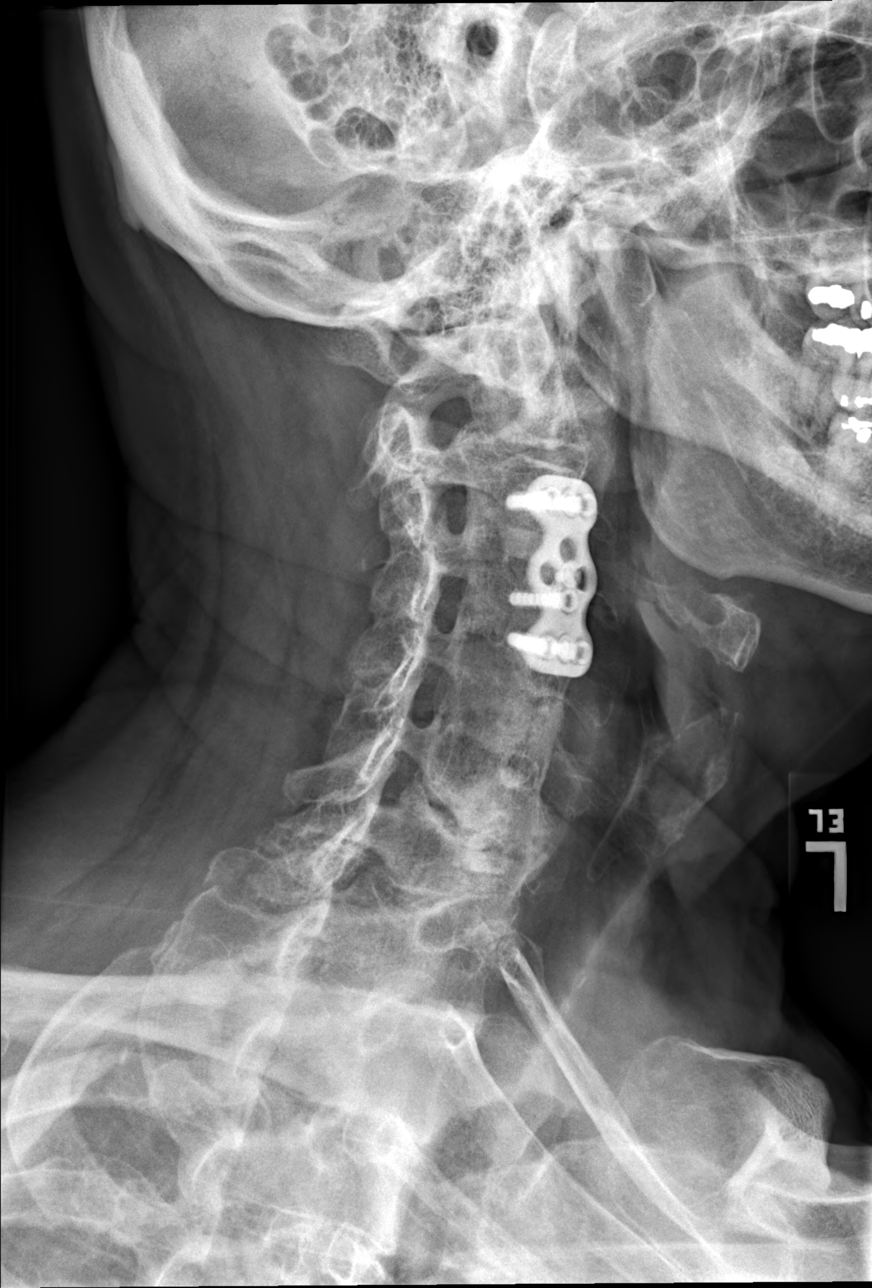

[w cervical spine ap_obl (2 of 2)]
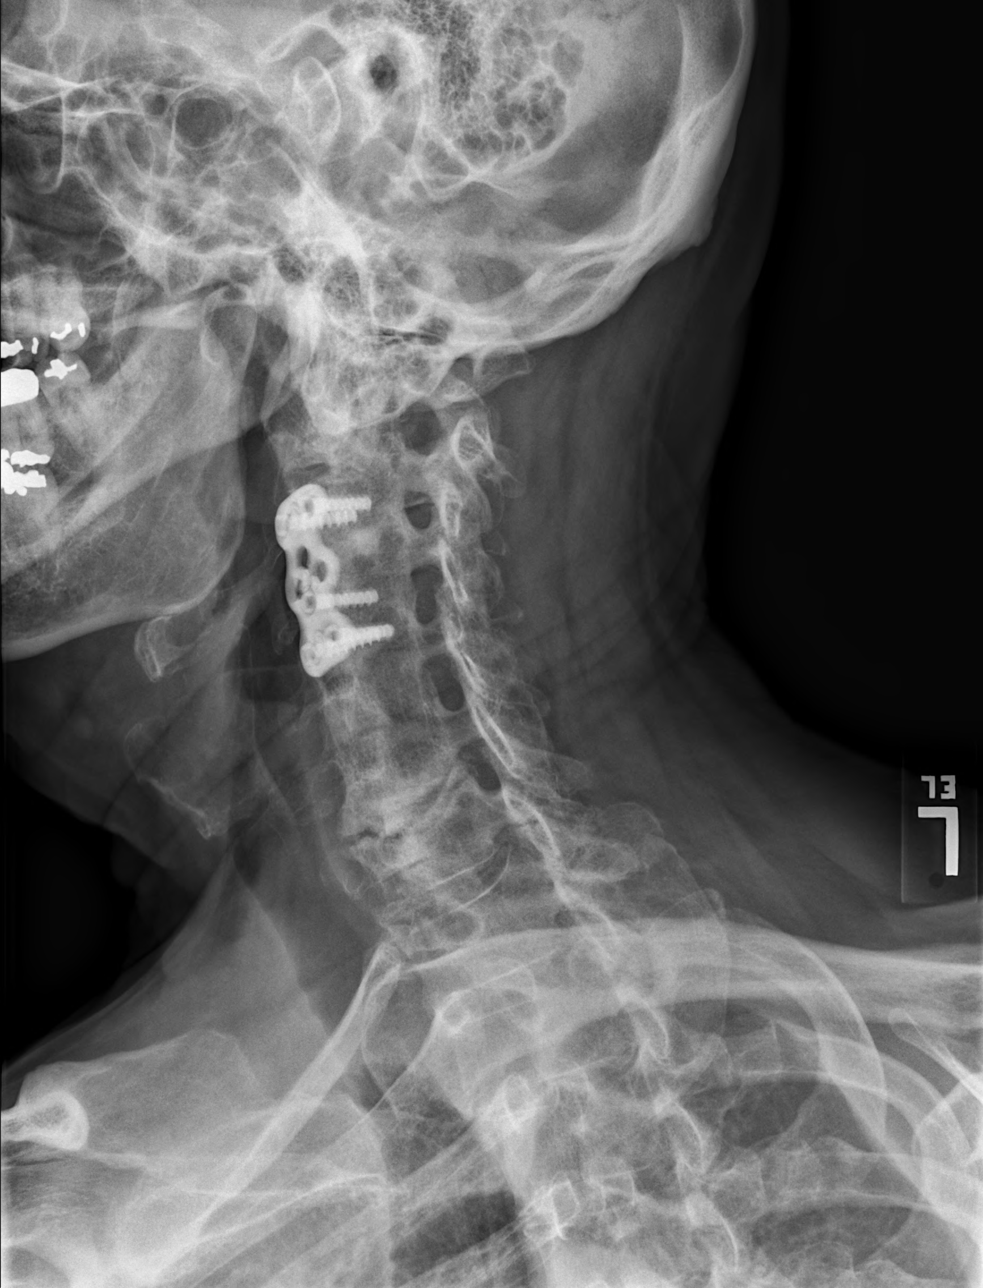

[5 of 5 positions shown; findings below may reference images not displayed]

FINDINGS: Stable cervical fusions. The severe C6-7 degenerative disc spondylosis and facet arthropathy change looks similar to prior with moderate bilateral foraminal stenoses resulting. I see no subluxation or prevertebral soft tissue swelling. Intact odontoid.
IMPRESSION: No acute disease nor radiographic change.

STAT fax

## 2021-01-07 ENCOUNTER — Encounter: Admit: 2021-01-07 | Discharge: 2021-01-07 | Payer: No Typology Code available for payment source

## 2021-01-14 ENCOUNTER — Encounter: Admit: 2021-01-14 | Discharge: 2021-01-14 | Payer: No Typology Code available for payment source

## 2021-01-14 ENCOUNTER — Ambulatory Visit: Admit: 2021-01-14 | Discharge: 2021-01-15 | Payer: No Typology Code available for payment source

## 2021-01-14 DIAGNOSIS — K2270 Barrett's esophagus without dysplasia: Secondary | ICD-10-CM

## 2021-01-14 DIAGNOSIS — G4733 Obstructive sleep apnea (adult) (pediatric): Secondary | ICD-10-CM

## 2021-01-14 DIAGNOSIS — Z8719 Personal history of other diseases of the digestive system: Secondary | ICD-10-CM

## 2021-01-14 DIAGNOSIS — N200 Calculus of kidney: Secondary | ICD-10-CM

## 2021-01-14 DIAGNOSIS — I10 Essential (primary) hypertension: Secondary | ICD-10-CM

## 2021-01-14 DIAGNOSIS — E782 Mixed hyperlipidemia: Secondary | ICD-10-CM

## 2021-01-14 DIAGNOSIS — M1990 Unspecified osteoarthritis, unspecified site: Secondary | ICD-10-CM

## 2021-01-14 DIAGNOSIS — J449 Chronic obstructive pulmonary disease, unspecified: Secondary | ICD-10-CM

## 2021-01-14 DIAGNOSIS — Z9981 Dependence on supplemental oxygen: Secondary | ICD-10-CM

## 2021-01-14 DIAGNOSIS — E039 Hypothyroidism, unspecified: Secondary | ICD-10-CM

## 2021-01-14 DIAGNOSIS — F17200 Nicotine dependence, unspecified, uncomplicated: Secondary | ICD-10-CM

## 2021-01-14 DIAGNOSIS — M5030 Other cervical disc degeneration, unspecified cervical region: Secondary | ICD-10-CM

## 2021-01-14 DIAGNOSIS — I82409 Acute embolism and thrombosis of unspecified deep veins of unspecified lower extremity: Secondary | ICD-10-CM

## 2021-01-14 DIAGNOSIS — M5137 Other intervertebral disc degeneration, lumbosacral region: Secondary | ICD-10-CM

## 2021-01-14 DIAGNOSIS — F4312 Post-traumatic stress disorder, chronic: Secondary | ICD-10-CM

## 2021-01-14 DIAGNOSIS — E559 Vitamin D deficiency, unspecified: Secondary | ICD-10-CM

## 2021-01-14 DIAGNOSIS — T8859XA Other complications of anesthesia, initial encounter: Secondary | ICD-10-CM

## 2021-01-14 DIAGNOSIS — M542 Cervicalgia: Secondary | ICD-10-CM

## 2021-01-14 DIAGNOSIS — N400 Enlarged prostate without lower urinary tract symptoms: Secondary | ICD-10-CM

## 2021-01-14 DIAGNOSIS — G44029 Chronic cluster headache, not intractable: Secondary | ICD-10-CM

## 2021-01-14 DIAGNOSIS — R7301 Impaired fasting glucose: Secondary | ICD-10-CM

## 2021-01-14 DIAGNOSIS — K219 Gastro-esophageal reflux disease without esophagitis: Secondary | ICD-10-CM

## 2021-01-14 DIAGNOSIS — F1291 Marijuana use in remission: Secondary | ICD-10-CM

## 2021-01-14 DIAGNOSIS — F32A Depression: Secondary | ICD-10-CM

## 2021-01-19 ENCOUNTER — Encounter: Admit: 2021-01-19 | Discharge: 2021-01-19 | Payer: No Typology Code available for payment source

## 2021-01-19 ENCOUNTER — Ambulatory Visit: Admit: 2021-01-19 | Discharge: 2021-01-19 | Payer: No Typology Code available for payment source

## 2021-01-19 DIAGNOSIS — E559 Vitamin D deficiency, unspecified: Secondary | ICD-10-CM

## 2021-01-19 DIAGNOSIS — T8859XA Other complications of anesthesia, initial encounter: Secondary | ICD-10-CM

## 2021-01-19 DIAGNOSIS — M5137 Other intervertebral disc degeneration, lumbosacral region: Secondary | ICD-10-CM

## 2021-01-19 DIAGNOSIS — I10 Essential (primary) hypertension: Secondary | ICD-10-CM

## 2021-01-19 DIAGNOSIS — G4733 Obstructive sleep apnea (adult) (pediatric): Secondary | ICD-10-CM

## 2021-01-19 DIAGNOSIS — M1990 Unspecified osteoarthritis, unspecified site: Secondary | ICD-10-CM

## 2021-01-19 DIAGNOSIS — N200 Calculus of kidney: Secondary | ICD-10-CM

## 2021-01-19 DIAGNOSIS — I82409 Acute embolism and thrombosis of unspecified deep veins of unspecified lower extremity: Secondary | ICD-10-CM

## 2021-01-19 DIAGNOSIS — K2270 Barrett's esophagus without dysplasia: Secondary | ICD-10-CM

## 2021-01-19 DIAGNOSIS — E782 Mixed hyperlipidemia: Secondary | ICD-10-CM

## 2021-01-19 DIAGNOSIS — E039 Hypothyroidism, unspecified: Secondary | ICD-10-CM

## 2021-01-19 DIAGNOSIS — I671 Cerebral aneurysm, nonruptured: Principal | ICD-10-CM

## 2021-01-19 DIAGNOSIS — F32A Depression: Secondary | ICD-10-CM

## 2021-01-19 DIAGNOSIS — G44029 Chronic cluster headache, not intractable: Secondary | ICD-10-CM

## 2021-01-19 DIAGNOSIS — Z9981 Dependence on supplemental oxygen: Secondary | ICD-10-CM

## 2021-01-19 DIAGNOSIS — M5030 Other cervical disc degeneration, unspecified cervical region: Secondary | ICD-10-CM

## 2021-01-19 DIAGNOSIS — N400 Enlarged prostate without lower urinary tract symptoms: Secondary | ICD-10-CM

## 2021-01-19 DIAGNOSIS — F1291 Marijuana use in remission: Secondary | ICD-10-CM

## 2021-01-19 DIAGNOSIS — F4312 Post-traumatic stress disorder, chronic: Secondary | ICD-10-CM

## 2021-01-19 DIAGNOSIS — F17200 Nicotine dependence, unspecified, uncomplicated: Secondary | ICD-10-CM

## 2021-01-19 DIAGNOSIS — Z8719 Personal history of other diseases of the digestive system: Secondary | ICD-10-CM

## 2021-01-19 DIAGNOSIS — R7301 Impaired fasting glucose: Secondary | ICD-10-CM

## 2021-01-19 DIAGNOSIS — K219 Gastro-esophageal reflux disease without esophagitis: Secondary | ICD-10-CM

## 2021-01-19 DIAGNOSIS — J449 Chronic obstructive pulmonary disease, unspecified: Secondary | ICD-10-CM

## 2021-01-19 DIAGNOSIS — M542 Cervicalgia: Secondary | ICD-10-CM

## 2021-01-19 LAB — POC CREATININE, RAD: CREATININE, POC: 0.8 mg/dL (ref 0.4–1.24)

## 2021-01-19 MED ORDER — DIPHENHYDRAMINE HCL 50 MG/ML IJ SOLN
25 mg | INTRAVENOUS | 0 refills | Status: DC | PRN
Start: 2021-01-19 — End: 2021-01-19

## 2021-01-19 MED ORDER — HEPARIN (PORCINE) 1,000 UNIT/ML IJ SOLN
0 refills | Status: CP
Start: 2021-01-19 — End: ?
  Administered 2021-01-19: 17:00:00 3000 [IU] via INTRAVENOUS

## 2021-01-19 MED ORDER — VERAPAMIL 2.5 MG/ML IV SOLN
0 refills | Status: CP
Start: 2021-01-19 — End: ?
  Administered 2021-01-19: 17:00:00 2.5 mg via INTRA_ARTERIAL

## 2021-01-19 MED ORDER — DIPHENHYDRAMINE HCL 25 MG PO CAP
25 mg | ORAL | 0 refills | Status: DC | PRN
Start: 2021-01-19 — End: 2021-01-19

## 2021-01-19 MED ORDER — NITROGLYCERIN(#) INJ 100MCG/ML IJ SOLN
0 refills | Status: CP
Start: 2021-01-19 — End: ?
  Administered 2021-01-19: 17:00:00 200 ug via INTRA_ARTERIAL

## 2021-01-19 MED ORDER — OXYCODONE 5 MG PO TAB
5 mg | ORAL_TABLET | ORAL | 0 refills | 6.00000 days | Status: AC | PRN
Start: 2021-01-19 — End: ?
  Filled 2021-01-19: qty 5, 2d supply, fill #1

## 2021-01-19 MED ORDER — IOHEXOL 300 MG IODINE/ML IV SOLN
75 mL | Freq: Once | INTRA_ARTERIAL | 0 refills | Status: CP
Start: 2021-01-19 — End: ?
  Administered 2021-01-19: 17:00:00 75 mL via INTRA_ARTERIAL

## 2021-01-19 MED ORDER — MIDAZOLAM 1 MG/ML IJ SOLN
1 mg | Freq: Once | INTRAVENOUS | 0 refills | Status: CP
Start: 2021-01-19 — End: ?
  Administered 2021-01-19: 16:00:00 0.5 mg via INTRAVENOUS

## 2021-01-19 MED ORDER — ONDANSETRON HCL (PF) 4 MG/2 ML IJ SOLN
4 mg | INTRAVENOUS | 0 refills | Status: DC | PRN
Start: 2021-01-19 — End: 2021-01-19
  Administered 2021-01-19: 16:00:00 4 mg via INTRAVENOUS

## 2021-01-19 MED ORDER — OXYCODONE 5 MG PO TAB
5 mg | Freq: Once | ORAL | 0 refills | Status: CP
Start: 2021-01-19 — End: ?
  Administered 2021-01-19: 18:00:00 5 mg via ORAL

## 2021-01-19 NOTE — H&P (View-Only)
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 male with PMH of cerebral aneurysm. He presents to IR today for diagnostic cerebral arteriogram. He reports neck pain today with radiation into his head. He feels nauseated. He reports this is a chronic, ongoing issue which can gradually worsen. He generally treats the neck pain with Oxycodone at home. However, he currently is out of Oxycodone. He reports he did not take his morning medication.     Patient Active Problem List    Diagnosis Date Noted   ? Cerebral aneurysm 09/17/2020   ? SDH (subdural hematoma) 07/08/2020   ? Brain compression (HCC) 07/08/2020   ? Adult hypothyroidism    ? Barrett's esophagus    ? Current every day smoker    ? Mixed hyperlipidemia    ? Chronic cluster headache    ? COPD (chronic obstructive pulmonary disease) (HCC)    ? 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 Vitamin D deficiency    ? OSA (obstructive sleep apnea)    ? GERD with stricture    ? Enlarged prostate      Medical History:   Diagnosis Date   ? Adult hypothyroidism    ? Arthritis    ? Barrett's esophagus    ? Cervicalgia    ? Chronic cluster headache    ? 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 Complication of anesthesia    ? COPD (chronic obstructive pulmonary disease) (HCC)    ? Current every day smoker    ? DDD (degenerative disc disease), lumbosacral    ? Degenerative disc disease, cervical    ? Dependence on supplemental oxygen    ? Depression    ? DVT (deep venous thrombosis) (HCC) 1970    in a leg   ? Enlarged prostate    ? GERD (gastroesophageal reflux disease)    ? GERD with stricture    ? History of dental problems     Several upper and lower molars are missing   ? Hypertension    ? Impaired fasting glucose    ? Kidney stone 1969   ? Marijuana use     currently uses daily   ? Mixed hyperlipidemia    ? OSA (obstructive sleep apnea)     severe, refuse to use CPAP   ? Vitamin D deficiency       Surgical History:   Procedure Laterality Date   ? BONE RESECTION, RIB Left 1970    R/T floating rib   ? ESOPHAGEAL DILATATION  1995   ? UPPER GASTROINTESTINAL ENDOSCOPY  08/2020    07/2020   ? HX SINUS SURGERY      1999, 2001      Medications Prior to Admission   Medication Sig Dispense Refill Last Dose   ? aspirin EC 81 mg tablet Take 81 mg by mouth daily. Take with food.   01/19/2021   ? cholecalciferol (vitamin D3) (D3-2000 PO) Take 1 tablet by mouth daily.   01/19/2021   ? Epinephrine Base 0.3 mg/0.3 mL syrg Inject  to area(s) as directed. Indications: a significant type of allergic reaction  called anaphylaxis, bee venom allergy      ? fluticasone propionate (FLONASE) 50 mcg/actuation nasal spray, suspension Apply 2 sprays to each nostril as directed daily. Shake bottle gently before using.      ? gabapentin (NEURONTIN) 300 mg capsule Take 300 mg by mouth three times daily as needed.   01/19/2021   ? levothyroxine (SYNTHROID) 50 mcg tablet Take 50 mcg by mouth daily 30 minutes before breakfast.   01/19/2021   ? lisinopriL-hydrochlorothiazide (ZESTORETIC) 20-12.5 mg tablet Take 2 tablets by mouth daily.   01/18/2021   ? loratadine (CLARITIN) 10 mg tablet Take 10 mg by mouth every morning.   01/19/2021   ? omeprazole DR (PRILOSEC) 40 mg capsule Take 40 mg by mouth daily before breakfast.   01/19/2021   ? rosuvastatin (CRESTOR) 5 mg tablet Take 2.5 mg by mouth three times weekly.   01/18/2021   ? SUMAtriptan succinate (IMITREX) 50 mg tablet Take 50 mg by mouth once. Take one tablet by mouth at onset of headache. May repeat after 2 hours if needed. Max of 200 mg in 24 hours.   Past Week     Allergies   Allergen Reactions   ? Ciprofloxacin RASH   ? Demerol [Meperidine] SWEATING   ? Penicillin RASH       Social History:   Social History     Tobacco Use   ? Smoking status: Current Every Day Smoker     Packs/day: 1.50     Years: 57.00     Pack years: 85.50     Types: Cigarettes   ? Smokeless tobacco: Never Used   ? Tobacco comment: use to vape alittle. quit 3-4 months ago. ( less than a year.)   Substance Use Topics   ? Alcohol use: Never      Family History   Problem Relation Age of Onset   ? Arthritis Mother    ? Cancer Mother    ? Colon Polyps Mother    ? Migraines Mother    ? Hypertension Mother    ? Hyperlipidemia Mother    ? Cancer Father    ? Hernia Father    ? Emphysema Father    ? Cancer Brother    ? Colon Polyps Brother         Review of Systems  Constitutional: negative  Ears, nose, mouth, throat, and face: negative  Respiratory: negative  Cardiovascular: negative  Gastrointestinal: + nausea   Musculoskeletal:+ neck pain   Neurological: negative  Behavioral/Psych: negative      Previous Anesthetic/Sedation History:  Reviewed     Code Status: Prior    Physical Exam:  Vital Signs: Last Filed In 24 Hours Vital Signs: 24 Hour Range   BP: 172/109 (11/09 0923)  Pulse: 93 (11/09 0923)  Respirations: 14 PER MINUTE (11/09 0923)  SpO2: 94 % (11/09 0923)  O2 Device: None (Room air) (11/09 0923)  SpO2 Pulse: 87 (11/09 0923) BP: (172)/(109)   Pulse:  [93]   Respirations:  [14 PER MINUTE]   SpO2:  [94 %]   O2 Device: None (Room air)   Intensity Pain Scale (Self Report): 8 (01/19/21 0923)        General appearance: alert and no distress  Neurologic: Grossly normal, at baseline  Lungs: Nonlabored with normal effort  Abdomen: soft, non-tender.   Extremities: extremities normal, atraumatic, no cyanosis or edema        Airway:  airway assessment performed  Mallampati II (soft palate, uvula, fauces  visible)   Anesthesia Classification:  ASA III (A patient with a severe systemic disease that limits activity, but is not incapacitating)  Pre procedure anxiolysis plan: Midazolam  Sedation/Medication Plan: Fentanyl, Lidocaine and Midazolam  Personal history of sedation complications: Denies adverse event.   Family history of sedation complications: Denies adverse event.   Medications for Reversal: Naloxone and Flumazenil  Discussion/Reviews:  Physician has discussed risks and alternatives of this type of sedation and above planned procedures with patient  NPO Status: Acceptable  Pregnancy Status: N/A        Lab/Radiology/Other Diagnostic Tests:  Labs:  Pertinent labs reviewed           Vernetta Honey, APRN-NP  Pager [phone]

## 2021-01-19 NOTE — Other
Immediate Post Procedure Note  01/19/21      Attending Physician: Consuela Mimes, MD  Assistant(s): Joselyn Glassman, MD  Procedure(s): Diagnostic cerebral angiogram   Indications: Anterior communicating artery aneurysm  Findings: 38mm anterior communicating artery aneurysm  Anesthesia: conscious sedation/local  Sedation/Medication Plan: 0.5mg  versed  Time out performed: Consent obtained, correct patient verified, correct procedure verified, correct site verified, patient marked as necessary.   Estimated Blood Loss: None/Negligible   Specimen(s) Removed/Disposition: None   Complications: None   Comments:     TR band removal, Will make follow-up clinic appointment    Consuela Mimes, MD  [phone]    Coding:  [phone] - [place] access rt radial  44315 - 50 - Bilateral ICA  40086 - 3D left ICA  99152 - Sedation

## 2021-01-19 NOTE — Progress Notes
Pt recovery complete.     Ambulatory status: Wheelchair- Transfers without Assist .   Vitals stable. Dressing is clean, dry, and intact.  Pain level returned to baseline.  Discharge instructions reviewed with: Patient  AVS signed and provided to patient.    Pt discharged to main lobby via wheelchair transport.

## 2021-01-19 NOTE — Progress Notes
Pt arrived to IR pre/post for procedure work up.     Mobility status: Ambulatory  Procedure and discharge instructions reviewed and pt verbalizes understanding.   Pt cart locked in low position, call light within reach.   See doc flowsheets for further details.    Will continue to monitor patient.

## 2021-01-20 ENCOUNTER — Encounter: Admit: 2021-01-20 | Discharge: 2021-01-20 | Payer: No Typology Code available for payment source

## 2021-01-25 IMAGING — MR MRI BRAIN WO CONTRAST
9 series · 48 of 48 positions shown · non-contrast
Comparison: CT brain 09/10/20

INDICATION: 74 years-old Male with Mild cognitive impairment of uncertain or unknown etiology.
TECHNIQUE: Multiplanar multisequence MRI of the brain was performed without intravenous contrast.

[Series 1: bSSFP · axial · 8.0mm · 1.17mm/px · 1 of 19 slices shown]
[im 1/19]
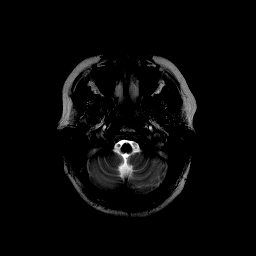

[Series 2: t1_mprage_axial · axial · 1.0mm · 1.00mm/px · z∈[-52,+107]mm · 11 of 160 slices shown]
[im 1/160]
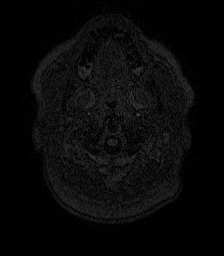
[im 16/160]
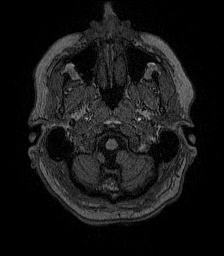
[im 32/160]
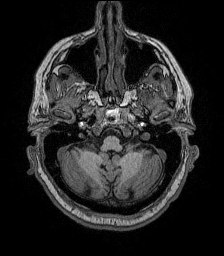
[im 48/160]
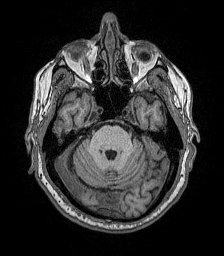
[im 64/160]
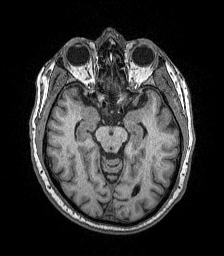
[im 80/160]
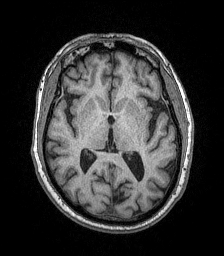
[im 96/160]
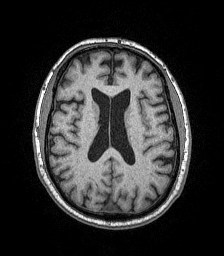
[im 112/160]
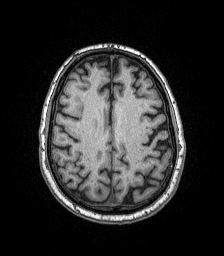
[im 128/160]
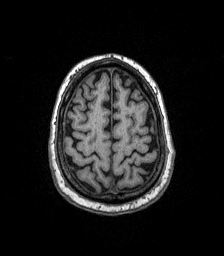
[im 144/160]
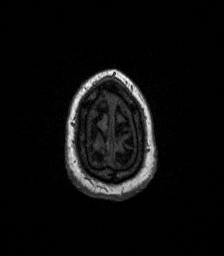
[im 160/160]
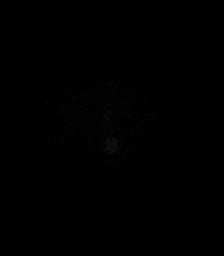

[Series 3: flair_axial_fs · axial · 5.0mm · 0.45mm/px · z∈[-45,+98]mm · 2 of 24 slices shown]
[im 1/24]
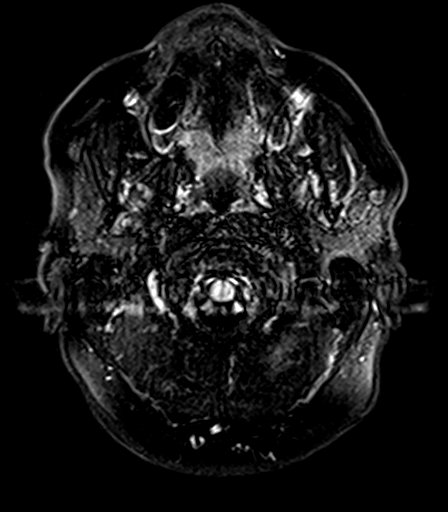
[im 24/24]
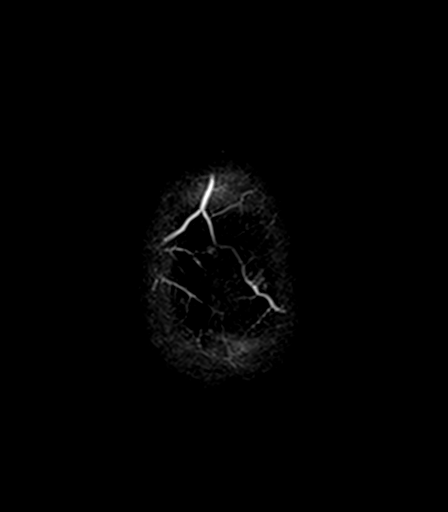

[Series 4: t2_axial · axial · 5.0mm · 0.72mm/px · z∈[-45,+98]mm · 2 of 24 slices shown]
[im 1/24]
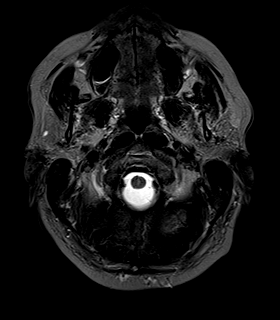
[im 24/24]
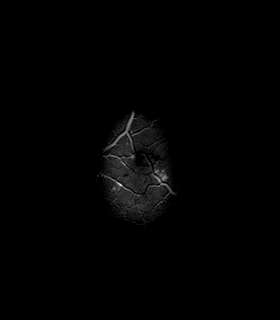

[Series 5: DWI · axial · 5.0mm · 1.26mm/px · z∈[-44,+98]mm · 2 of 23 slices shown (1 of 2)]
[im 1/23]
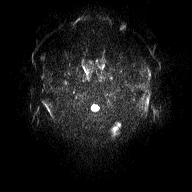
[im 23/23]
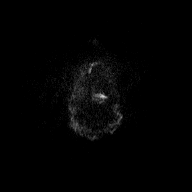

[Series 6: DWI · axial · 5.0mm · 1.26mm/px · z∈[-44,+98]mm · 2 of 23 slices shown (2 of 2)]
[im 1/23]
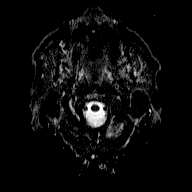
[im 23/23]
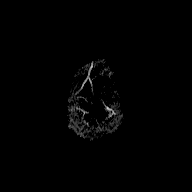

[Series 7: flash_axial · axial · 5.0mm · 0.45mm/px · z∈[-45,+98]mm · 2 of 24 slices shown]
[im 1/24]
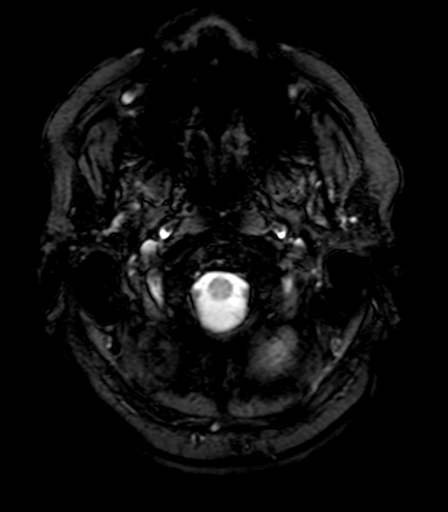
[im 24/24]
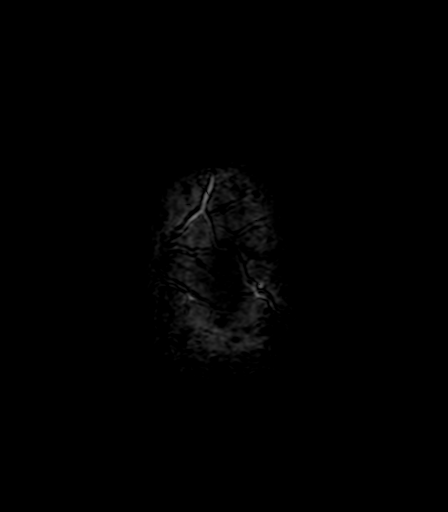

[Series 8: t1_mprage_cor_reformat · coronal · 1.5mm · 1.00mm/px · 14 of 199 slices shown]
[im 1/199]
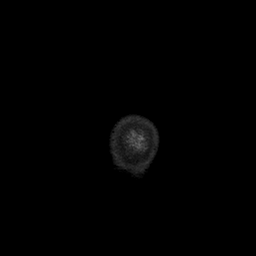
[im 16/199]
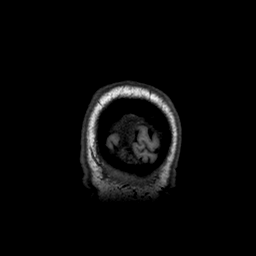
[im 31/199]
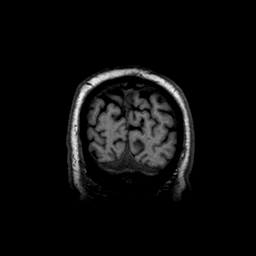
[im 46/199]
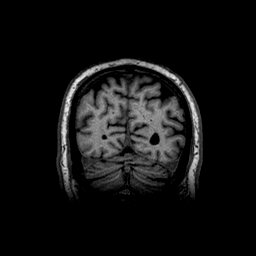
[im 61/199]
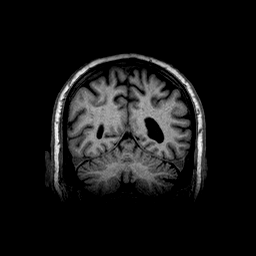
[im 77/199]
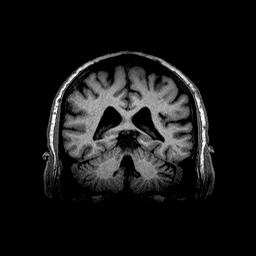
[im 92/199]
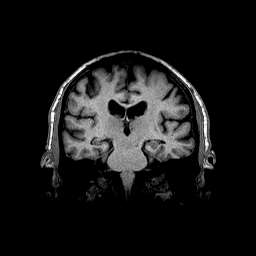
[im 107/199]
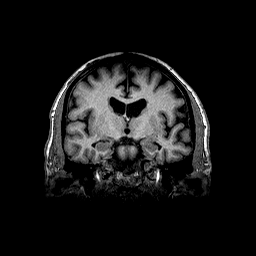
[im 122/199]
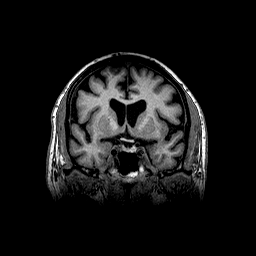
[im 138/199]
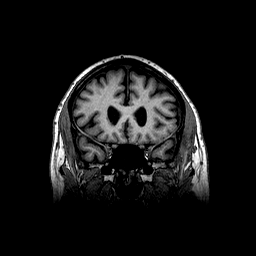
[im 153/199]
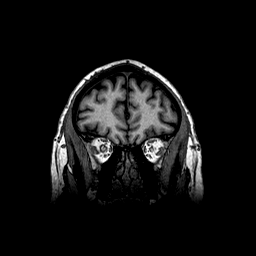
[im 168/199]
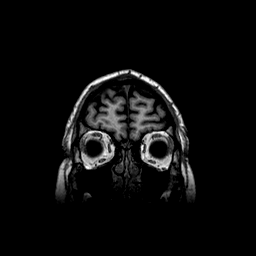
[im 183/199]
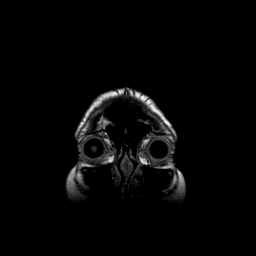
[im 199/199]
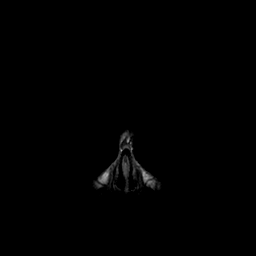

[Series 9: t1_mprage_sag_reformat · sagittal · 1.5mm · 1.00mm/px · 12 of 165 slices shown]
[im 1/165]
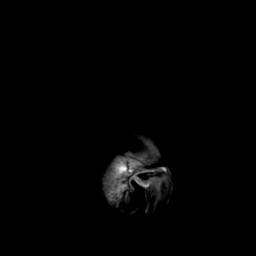
[im 15/165]
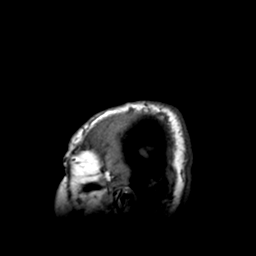
[im 30/165]
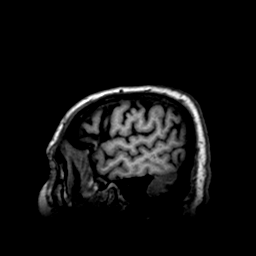
[im 45/165]
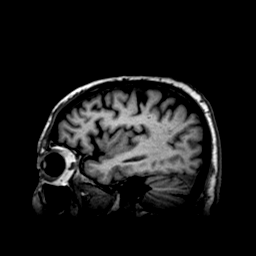
[im 60/165]
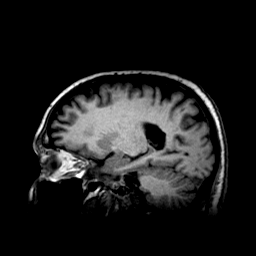
[im 75/165]
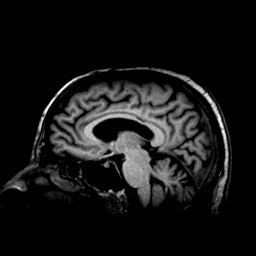
[im 90/165]
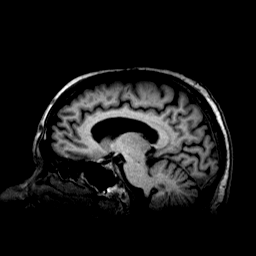
[im 105/165]
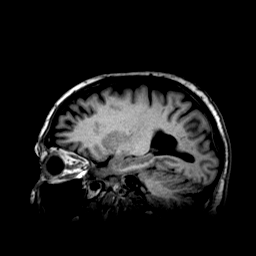
[im 120/165]
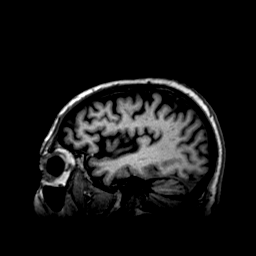
[im 135/165]
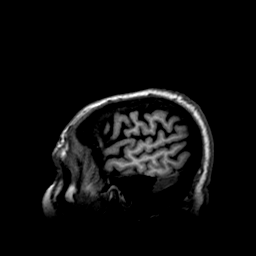
[im 150/165]
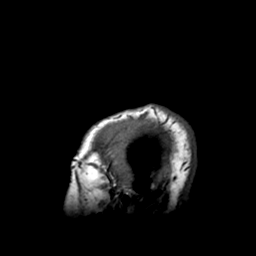
[im 165/165]
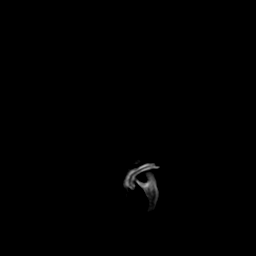

[48 of 48 positions shown; findings below may reference images not displayed]

FINDINGS: Brain parenchyma: No acute infarct or hemorrhage. Small focus of T2/FLAIR hyperintense signal in the left parietal lobe, nonspecific gliosis ([DATE]). Mild generalized parenchymal volume loss.

Ventricles: Mild ventriculomegaly, likely due to parenchymal volume loss.

Bairouk index: 0.31 (normal<0.30)

Callosal angle:  121.6 degrees (normal>100)

No midline shift or herniation.

Extra-axial spaces: No extra-axial fluid collection. No disproportionate enlarged subarachnoid spaces to suggest normal pressure hydrocephalus.

Extracranial structures: Minimal mucosal thickening in the bilateral ethmoid air cells. Marrow signal normal. Left ocular lens replacement. Soft tissues normal.
IMPRESSION: Mild generalized parenchymal volume loss.

Mild ventriculomegaly, likely due to parenchymal volume loss.

No other significant abnormality.

## 2021-01-28 NOTE — Progress Notes
[facility] VASCULAR CONFERENCE       Vascular Conference Date:  01-27-2021       Patient:   Tracy Mcmillan     Med Rec #:  [id number]       DOB:  [birth year]     Presenting Physician:    Vonita Moss     Neurosurgeon:  Vonita Moss     Attendance:   Kristin Bruins, Derrek Monaco, A Whiteside     Diagnosis: Acomm Aneurysm      Reason for presentation: .tx options  ?   --------------------------------------------------------------------------------------------------------------------------------   Vascular Abnormalities:       Date  Impression  Imaging          01-19-2021   1.  Angiographic study demonstrates a 7.3 mm wide by 5 mm tall anterior communicating artery bifurcation aneurysm with the left A1 being the dominant vessel and perpendicular to the axis of the aneurysm       IR CEREBRAL ARTERIOGRAM DIAGNOSTIC       01-19-2021   1.  Stable CT scan of the head with stable mild generalized cerebral volume loss and asymmetric prominence of the left subdural/subarachnoid space along the convexity. 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 In either case, it is unchanged dating back to the oldest study from 07/08/2020.   2. Limited visualization of reported known anterior communicating artery aneurysm in the absence of IV contrast, and will be evaluated on scheduled same day conventional angiogram.   3. No acute intracranial hemorrhage       CT HEAD WO CONTRAST       RECOMMENDATIONS/OPINIONS:      Endovascular  .    Surgery      Radiology Oncology      Other  Evaluate for Contour       --------------------------------------------------------------------------------------------------------------------------------      Romir Klimowicz Azucena Cecil, BFA, CCMA

## 2021-02-01 IMAGING — CR L-SPINE 4 VWS MIN
4 series · 4 of 4 positions shown · non-contrast
Comparison: Lumbar spine radiographs 06/16/2020

HISTORY: 75-year-old male with lumbar radiculopathy.
TECHNIQUE: 4 view radiograph of the lumbar spine. Flexion and extension images were obtained

[w lumbar spine ap]
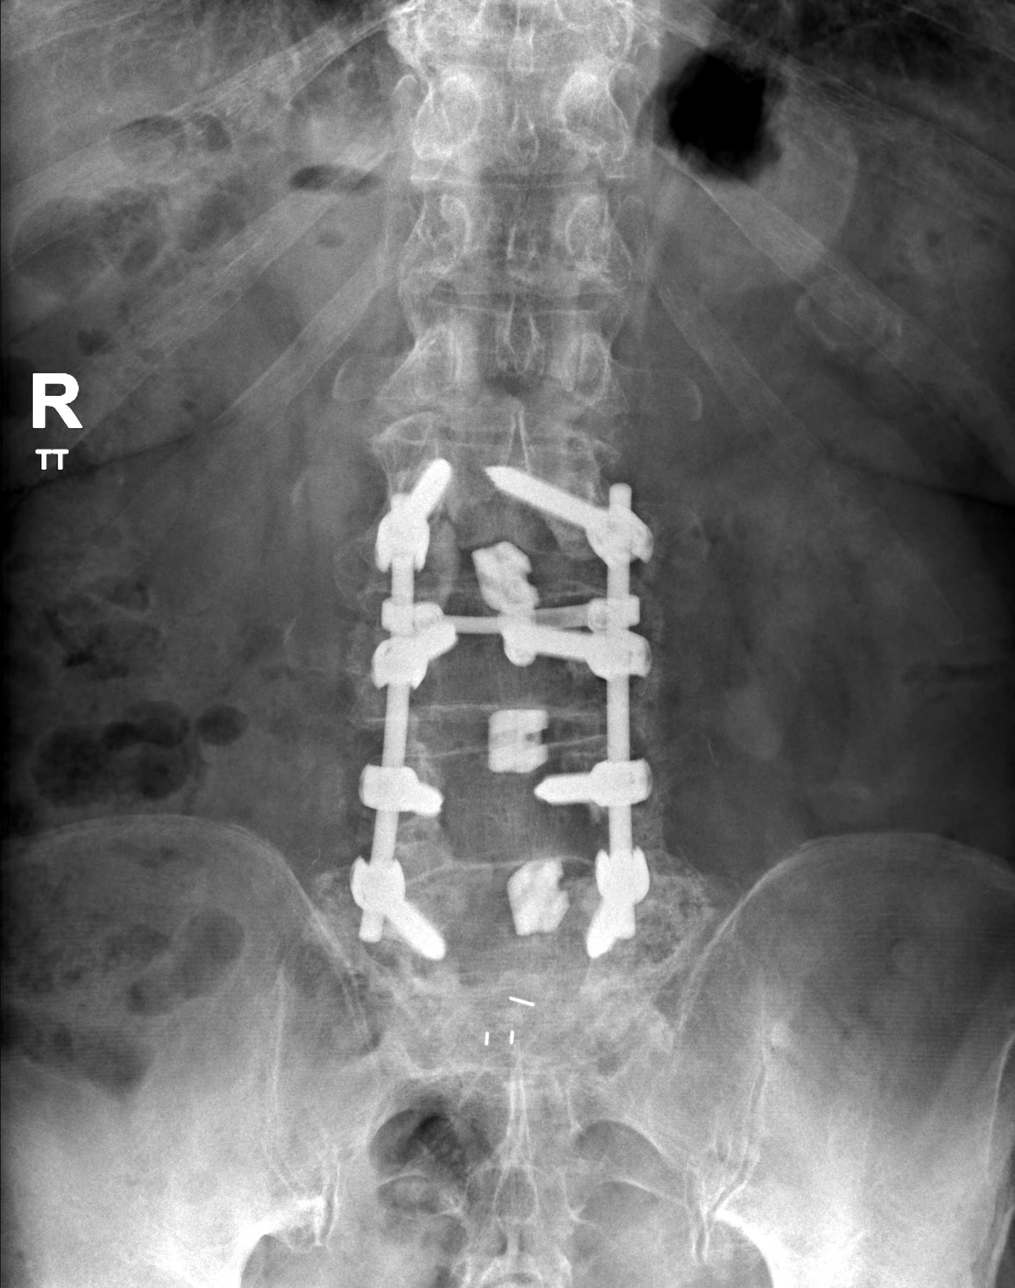

[w lumbar spine lat]
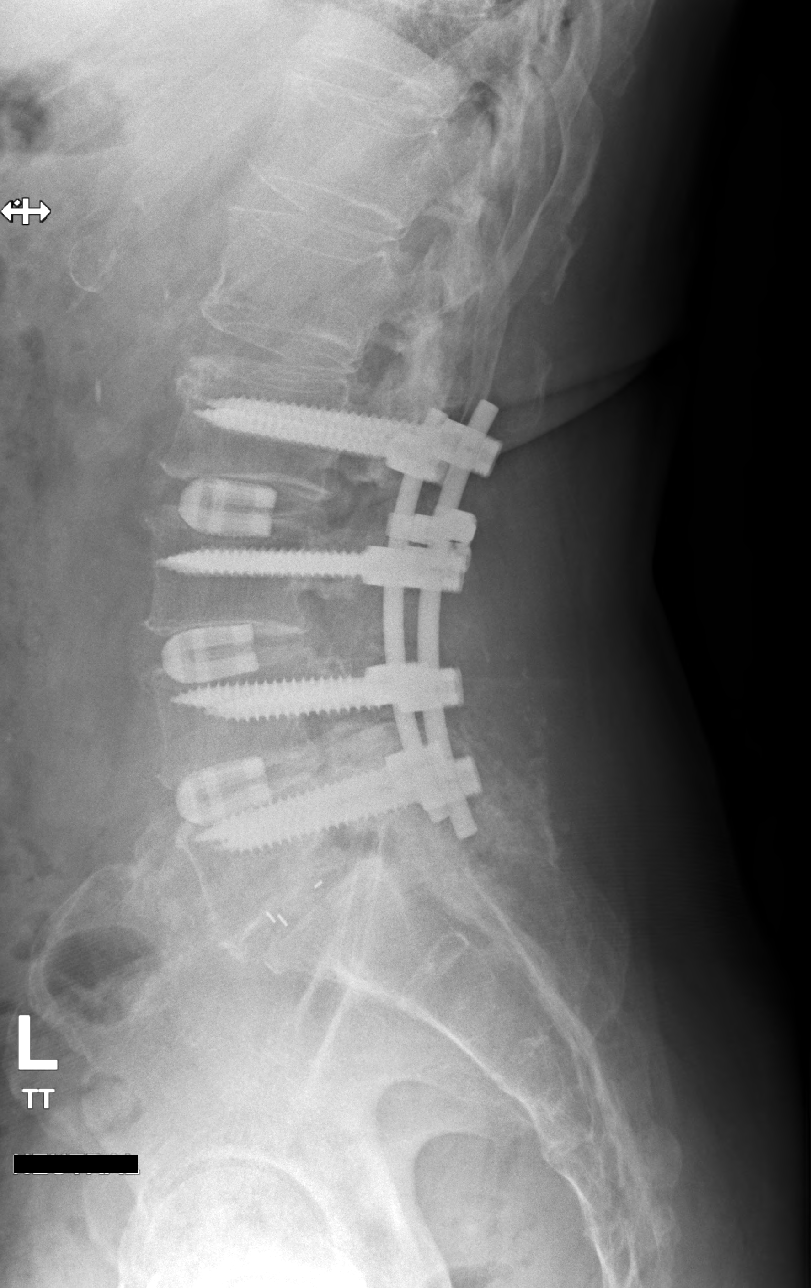

[w lumbar spine flexion]
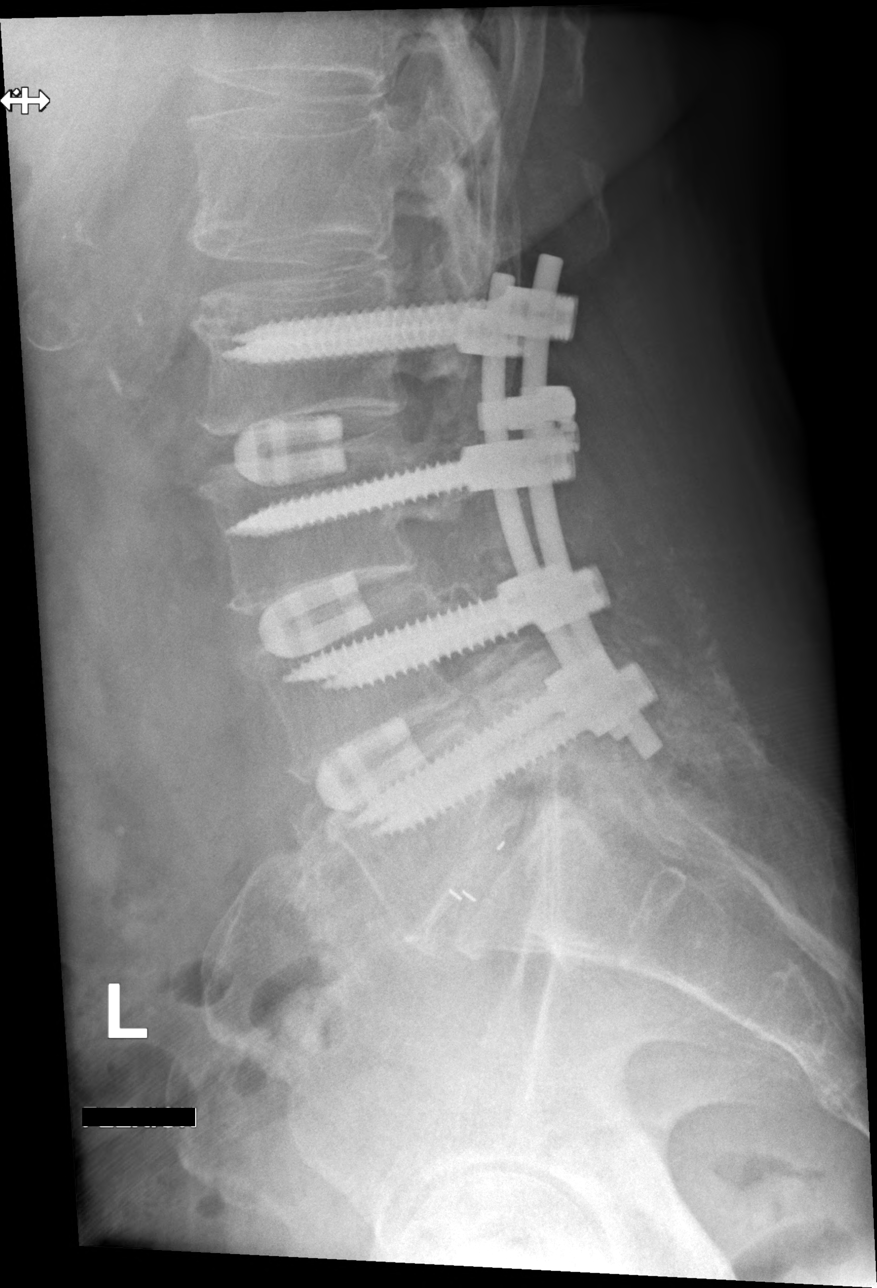

[w lumbar spine extension]
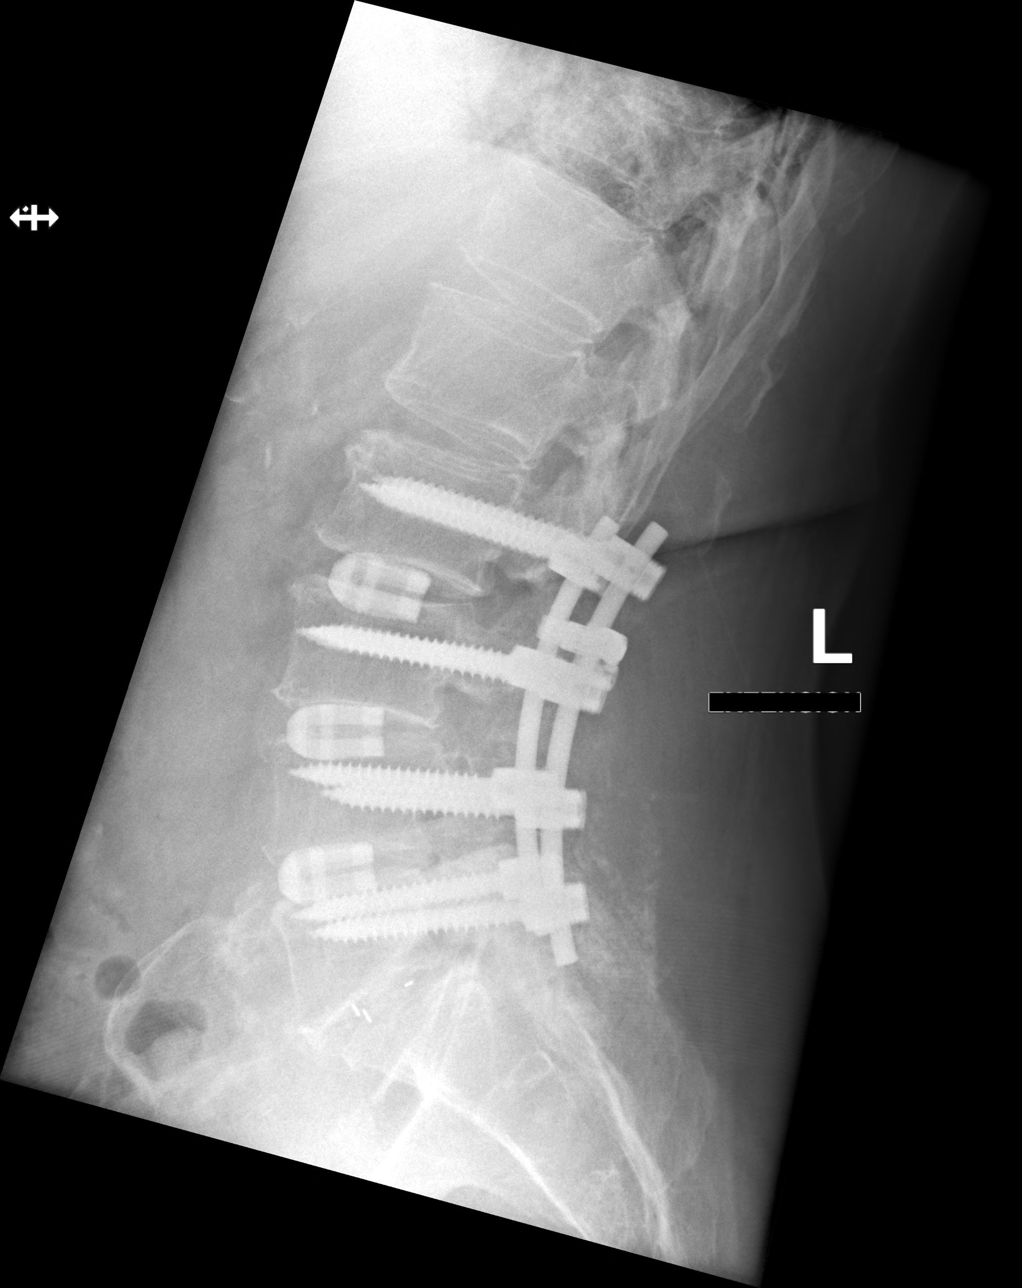

[4 of 4 positions shown; findings below may reference images not displayed]

FINDINGS: Please note that spinal labeling was performed assuming there are 5 non-rib-bearing vertebrae.

Previous posterior instrumented fusion at L2-5. No significant perimetallic lucency or evidence of hardware migration or fracture. 

Grade 1 anterolisthesis of L5 over S1, similar to prior. No translation on dynamic images. Unfused disks demonstrate multilevel degenerative disc disease.

Moderate bilateral SI joint osteoarthritis.
IMPRESSION: Postoperative changes without interval hardware complications. No translation on dynamic images.

## 2021-02-01 IMAGING — MR MRI LSPINE WO/W CONTRAST
10 of 12 series · 41 of 48 positions shown · IV contrast (prohance)
Comparison: Lumbar spine radiographs from same day.

HISTORY: 75-year-old male with lumbar radiculopathy.
TECHNIQUE: Multiplanar, multisequence MR images of the lumbar spine were obtained before and after administration of intravenous contrast. 

CONTRAST: 20 mL of ProHance

[Series 1: bSSFP · axial · 8.0mm · 1.37mm/px · z∈[-61,+175]mm · 4 of 25 slices shown]
[im 1/25]
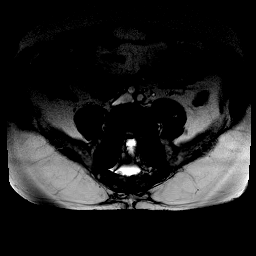
[im 9/25]
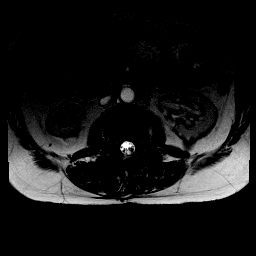
[im 17/25]
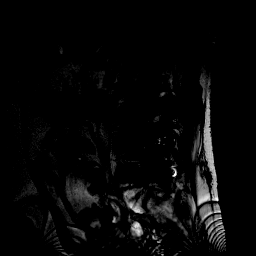
[im 25/25]
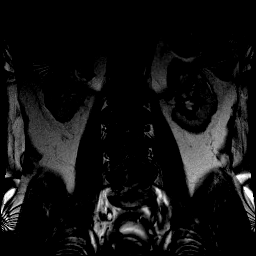

[Series 2: t2_cor_hbw · coronal · 4.0mm · 0.55mm/px · 2 of 14 slices shown]
[im 1/14]
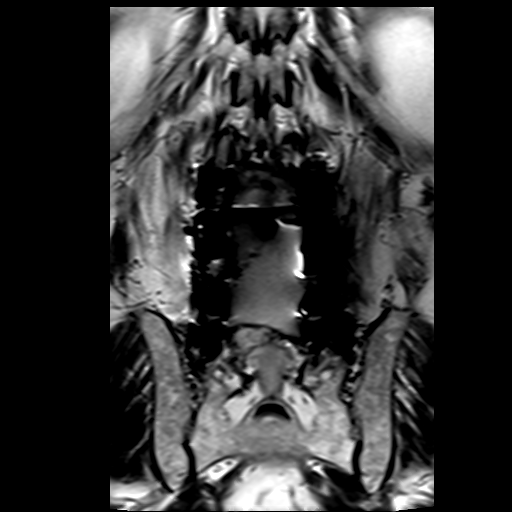
[im 14/14]
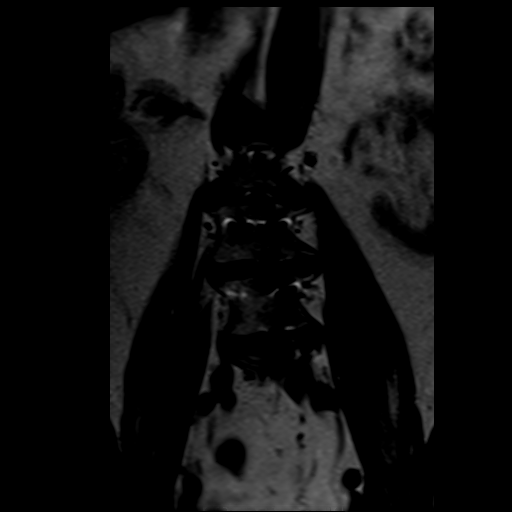

[Series 3: t2_sag_hbw · sagittal · 4.0mm · 0.47mm/px · 3 of 15 slices shown]
[im 1/15]
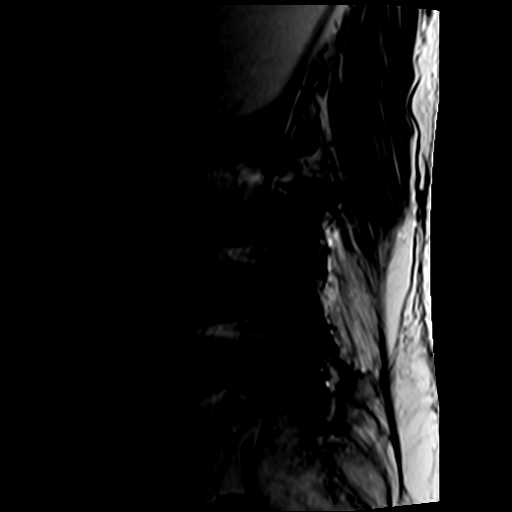
[im 8/15]
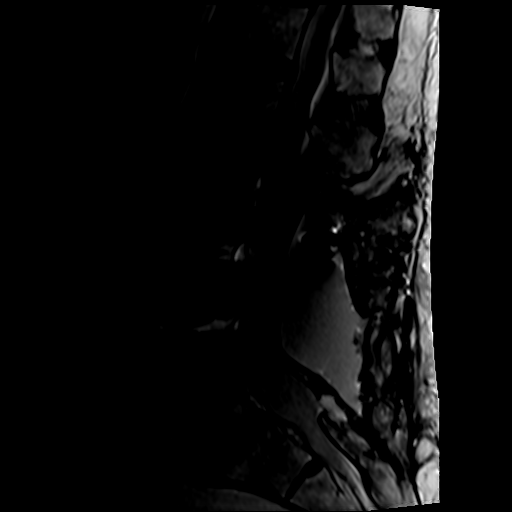
[im 15/15]
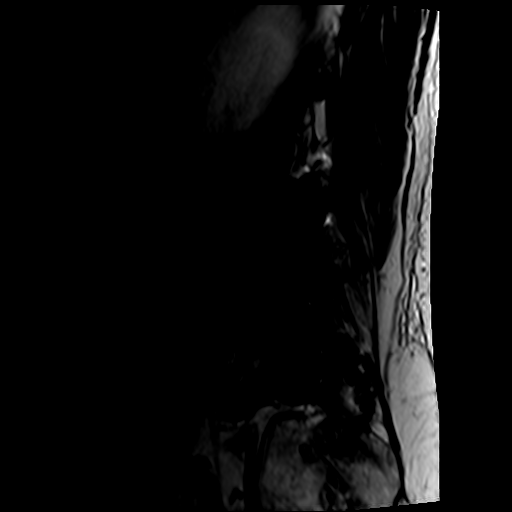

[Series 4: ir_sag_hbw · sagittal · 4.0mm · 0.47mm/px · 3 of 15 slices shown]
[im 1/15]
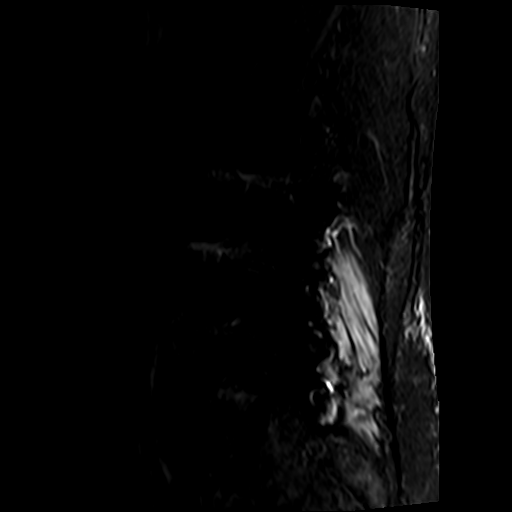
[im 8/15]
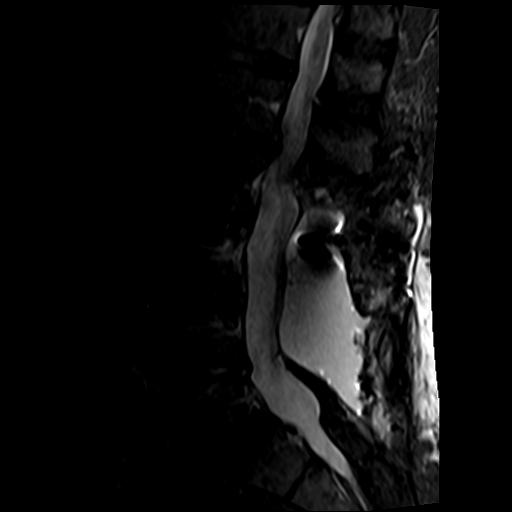
[im 15/15]
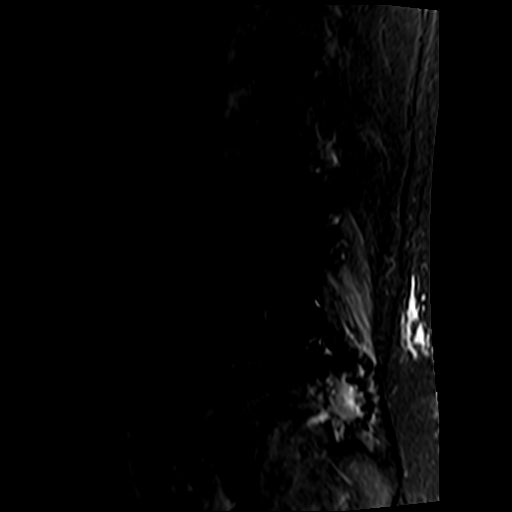

[Series 5: t1_sag_hbw · sagittal · 4.0mm · 0.94mm/px · 3 of 15 slices shown]
[im 1/15]
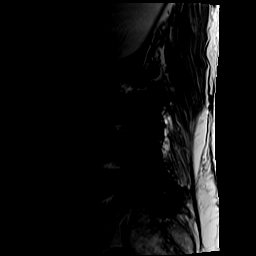
[im 8/15]
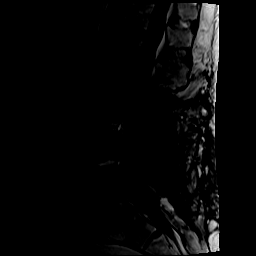
[im 15/15]
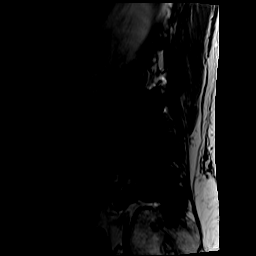

[Series 6: t1_axial_obl_hbw · axial · 4.0mm · 0.69mm/px · z∈[-129,+81]mm · 4 of 25 slices shown]
[im 1/25]
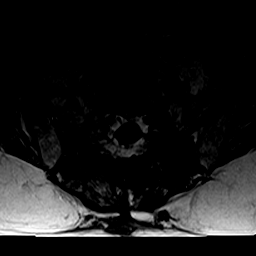
[im 9/25]
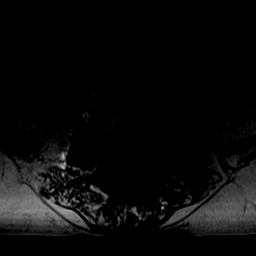
[im 17/25]
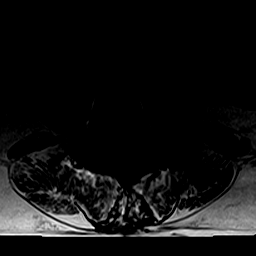
[im 25/25]
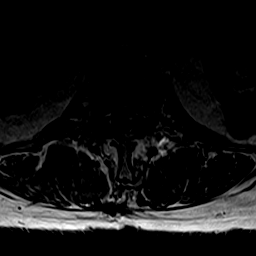

[Series 7: t2_axial_hbw · axial · 4.0mm · 0.65mm/px · z∈[-122,+67]mm · 6 of 34 slices shown]
[im 1/34]
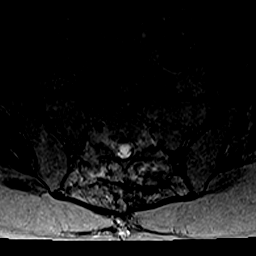
[im 7/34]
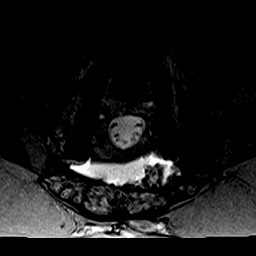
[im 14/34]
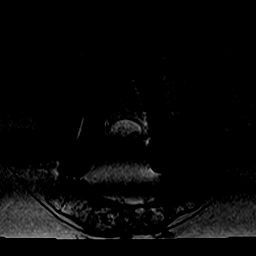
[im 20/34]
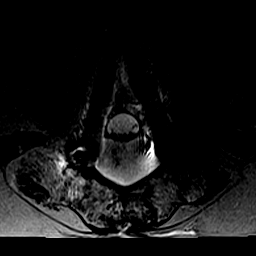
[im 27/34]
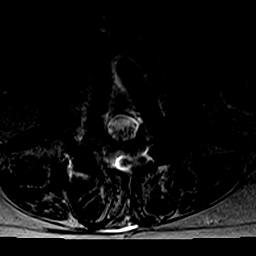
[im 34/34]
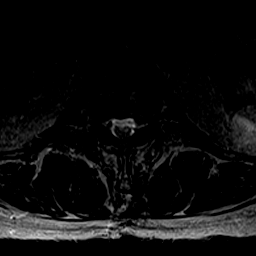

[Series 8: t1_axial_hbw · axial · 4.0mm · 0.66mm/px · z∈[-114,+65]mm · 6 of 37 slices shown]
[im 1/37]
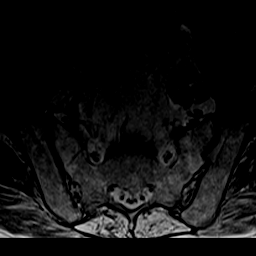
[im 8/37]
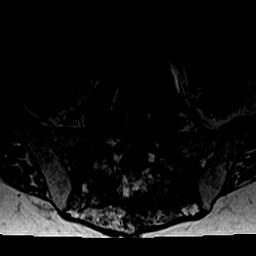
[im 15/37]
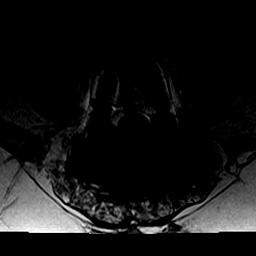
[im 22/37]
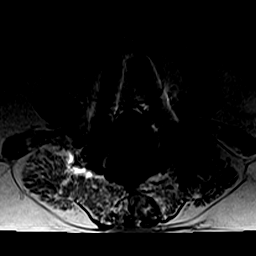
[im 29/37]
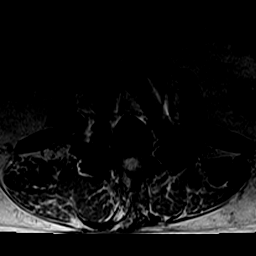
[im 37/37]
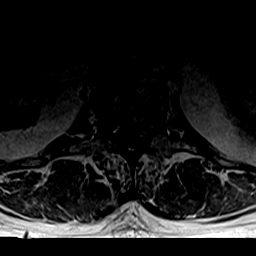

[Series 9: t1_axial_+c_hbw · axial · 4.0mm · 0.66mm/px · z∈[-114,+65]mm · 6 of 37 slices shown]
[im 1/37]
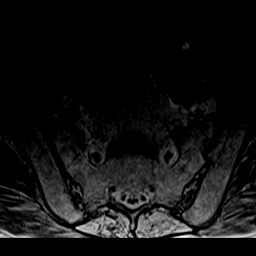
[im 8/37]
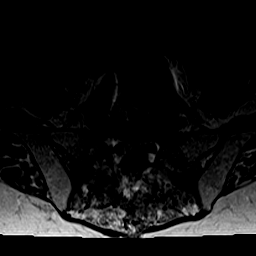
[im 15/37]
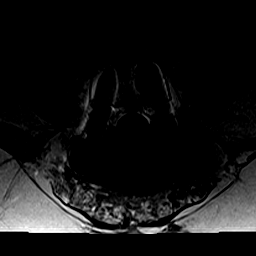
[im 22/37]
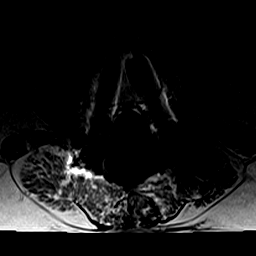
[im 29/37]
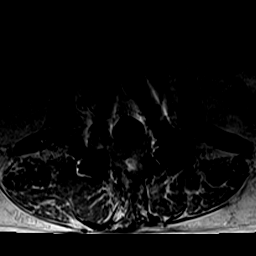
[im 37/37]
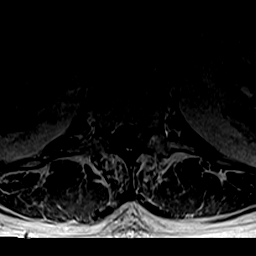

[Series 10: axial_sub · axial · 4.0mm · 0.66mm/px · z∈[-114,-10]mm · 4 of 37 slices shown]
[im 1/37]
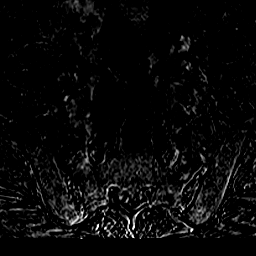
[im 8/37]
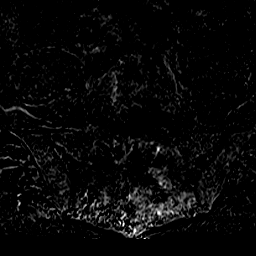
[im 15/37]
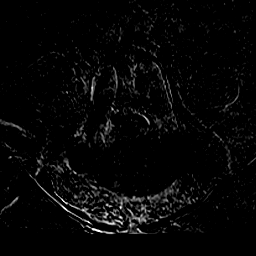
[im 22/37]
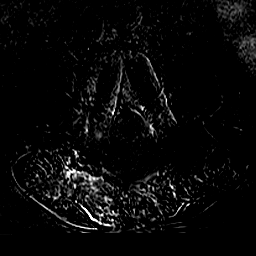

[41 of 48 positions shown; findings below may reference images not displayed]

FINDINGS: COUNT/LABELING: Please note that spinal labeling was performed assuming there are 5 non-rib-bearing lumbar type vertebrae.

ALIGNMENT: Retrolisthesis of L5 over S1.

VERTEBRAE:  Previous posterior decompression and instrumented fusion at L2-5. 

INTERVERTEBRAL DISCS:  Unfused disks demonstrate multilevel disc desiccation.

PARASPINAL SOFT TISSUES: There is a 35 x 50 x 99 mm T2 hyperintense collection along the posterior elements of the L2-5 vertebral bodies, which may be post surgical in etiology. Presence of infection cannot be excluded by imaging.

DISTAL THORACIC SPINAL CORD AND CONUS MEDULLARIS:  The distal thoracic spinal cord is normal in caliber and signal with the conus medullaris terminating at approximately L1, which is normal.

CAUDA EQUINA NERVE ROOTS: Unremarkable.

FINDINGS BY LEVEL:

T12-L1: No high-grade canal stenosis or foraminal narrowing.

L1-L2:  Disc bulge, facet arthropathy, ligamentum flavum hypertrophy and endplate spurring. Moderate canal stenosis with moderate bilateral lateral recess stenosis and encroachment on the descending bilateral cauda equina nerve roots. Moderate to severe bilateral foraminal narrowing.

L2-L3:  Disc bulge, facet arthropathy and endplate spurring. Moderate right foraminal narrowing. No high-grade left foraminal narrowing.

L3-L4:  No high-grade canal stenosis . Foramina are difficult to evaluate due to metallic artifact.

L4-L5:  No high-grade canal stenosis. Foramina are difficult to evaluate due to metallic artifact.

L5-S1:  No high-grade canal stenosis. No high-grade left foraminal narrowing. The right foramen is not well evaluated due to metallic artifact.

OTHER: T2 hyperintense lesions associated with both kidneys are not fully evaluated but statistically most likely to represent cysts.
IMPRESSION: 1.
Previous L2-5 fusion with a soft tissue collection along the posterior elements at these levels, which may be postsurgical in etiology. Infection cannot be excluded by imaging.

2.
Findings suggestive of adjacent level degeneration at L1-2 with moderate L1-2 canal stenosis with moderate bilateral lateral recess stenosis and encroachment on the descending bilateral cauda equina nerve roots and moderate to severe bilateral L1-2 foraminal narrowing.

3.
Moderate right L2-3 foraminal narrowing.

## 2021-02-09 ENCOUNTER — Encounter: Admit: 2021-02-09 | Discharge: 2021-02-09

## 2021-02-25 ENCOUNTER — Ambulatory Visit: Admit: 2021-02-25 | Discharge: 2021-02-26

## 2021-02-25 ENCOUNTER — Encounter: Admit: 2021-02-25 | Discharge: 2021-02-25

## 2021-02-25 DIAGNOSIS — M1990 Unspecified osteoarthritis, unspecified site: Secondary | ICD-10-CM

## 2021-02-25 DIAGNOSIS — F32A Depression: Secondary | ICD-10-CM

## 2021-02-25 DIAGNOSIS — F1291 Marijuana use in remission: Secondary | ICD-10-CM

## 2021-02-25 DIAGNOSIS — F17200 Nicotine dependence, unspecified, uncomplicated: Secondary | ICD-10-CM

## 2021-02-25 DIAGNOSIS — E782 Mixed hyperlipidemia: Secondary | ICD-10-CM

## 2021-02-25 DIAGNOSIS — F4312 Post-traumatic stress disorder, chronic: Secondary | ICD-10-CM

## 2021-02-25 DIAGNOSIS — G44029 Chronic cluster headache, not intractable: Secondary | ICD-10-CM

## 2021-02-25 DIAGNOSIS — M542 Cervicalgia: Secondary | ICD-10-CM

## 2021-02-25 DIAGNOSIS — T8859XA Other complications of anesthesia, initial encounter: Secondary | ICD-10-CM

## 2021-02-25 DIAGNOSIS — E559 Vitamin D deficiency, unspecified: Secondary | ICD-10-CM

## 2021-02-25 DIAGNOSIS — E039 Hypothyroidism, unspecified: Secondary | ICD-10-CM

## 2021-02-25 DIAGNOSIS — I10 Essential (primary) hypertension: Secondary | ICD-10-CM

## 2021-02-25 DIAGNOSIS — Z8719 Personal history of other diseases of the digestive system: Secondary | ICD-10-CM

## 2021-02-25 DIAGNOSIS — K219 Gastro-esophageal reflux disease without esophagitis: Secondary | ICD-10-CM

## 2021-02-25 DIAGNOSIS — K2270 Barrett's esophagus without dysplasia: Secondary | ICD-10-CM

## 2021-02-25 DIAGNOSIS — Z9981 Dependence on supplemental oxygen: Secondary | ICD-10-CM

## 2021-02-25 DIAGNOSIS — N400 Enlarged prostate without lower urinary tract symptoms: Secondary | ICD-10-CM

## 2021-02-25 DIAGNOSIS — N200 Calculus of kidney: Secondary | ICD-10-CM

## 2021-02-25 DIAGNOSIS — M5137 Other intervertebral disc degeneration, lumbosacral region: Secondary | ICD-10-CM

## 2021-02-25 DIAGNOSIS — R7301 Impaired fasting glucose: Secondary | ICD-10-CM

## 2021-02-25 DIAGNOSIS — I82409 Acute embolism and thrombosis of unspecified deep veins of unspecified lower extremity: Secondary | ICD-10-CM

## 2021-02-25 DIAGNOSIS — G4733 Obstructive sleep apnea (adult) (pediatric): Secondary | ICD-10-CM

## 2021-02-25 DIAGNOSIS — M5030 Other cervical disc degeneration, unspecified cervical region: Secondary | ICD-10-CM

## 2021-02-25 DIAGNOSIS — J449 Chronic obstructive pulmonary disease, unspecified: Secondary | ICD-10-CM

## 2021-02-25 NOTE — Patient Instructions
It was a pleasure seeing you today via Telehealth in the neurosurgery clinic. Dr. Vonita Moss would like to see you back in clinic in 1 month.  We have scheduled your Telehealth appointment for Friday, March 25, 2021 at 9:15am     If you have any questions or concerns, please don't hesitate to call me at [phone] or send a message via MyChart.  Thank you and take care!     Delene Loll, RN, BSN   RN Care Coordinator    Neurosurgery Clinic

## 2021-03-25 ENCOUNTER — Ambulatory Visit: Admit: 2021-03-25 | Discharge: 2021-03-26 | Payer: No Typology Code available for payment source

## 2021-03-25 DIAGNOSIS — I671 Cerebral aneurysm, nonruptured: Secondary | ICD-10-CM

## 2021-03-25 NOTE — Patient Instructions
It was a pleasure seeing you today in the neurosurgery clinic via Telehealth. Dr. Vonita Moss would like to see you back via 1 month.  Our scheduling team should contact you to schedule your appointments.     If you have any questions or concerns, please don't hesitate to call me at [phone] or send a message via MyChart.  Thank you and take care!     Delene Loll, RN, BSN   RN Care Coordinator   [facility]

## 2021-03-25 NOTE — Progress Notes
Date of Service: 03/25/2021    Subjective:             Tracy Mcmillan is a 76 y.o. 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  He was initially evaluated in April by Dr. Sena Hitch.    History of Present Illness    03/25/2021  Tracy Mcmillan again is doing well and is present over telehealth visit with his family.  He has not had any significant changes since we last talked with him.  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  We also discussed that we feel like he is likely a candidate for one of our trial devices and we would screen him for this if we did undergo evaluation for treatment.  We also discussed that he would likely need to be on dual antiplatelet therapy at least around the time of treatment in case it required stent assisted coiling.  We discussed the risk benefits and alternatives to this type of endovascular treatment.  We also discussed the likely treatment course if everything goes well he would likely be in the hospital for 1 day and plans for discharge home after that.    We also discussed that overall he does have some history of difficulties with anesthesia for a minor procedure and they discussed that he would need to be at the hospital for any more of his procedures at that time.  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  We did discuss that prior to procedure he would be evaluated by our anesthesia service and they would determine his overall risks and if he needed any further work-up related to any perioperative care.  Overall though we feel like he tolerated the angiogram fine and that we did have ileus low concerns at this point of performing the procedure.    We also discussed that we would try to get him in contact with our financial counseling as they are submitting his stuff to Medicare and he is a [place] patient and some of this should be filtered through them and we discussed that we would help facilitate whenever we need to from a financial standpoint.    At this point again the patient would like a little bit more time thinking about the options.  We will plan to follow-up again in 1 month for further discussion and to answer any questions.  If prior to this the patient should decide that he would like to pursue further treatment of the aneurysm we at that point we will engage the research team for final screening and evaluation of any of our study protocols that would fit for the patient's aneurysm.    Otherwise we also discussed that if he decides that he would rather just go conservative management with observation that we would plan for at least a CT angiogram 1 year after his last evaluation for continued follow-up of the aneurysm.      02/25/21  Tracy Mcmillan is overall doing well.  At this time we discussed the angiogram findings with him and his son.  He has questions about the overall plans for potential treatment.  We discussed that overall the recommendation from conference was treatment of his aneurysm which given its size and configuration has a overall phases score of 9 with a 4.3% risk  of rupture every 5 years.    Given the location and configuration we do feel like this would be amenable to one of our study devices that we are currently evaluating.  At this point the patient would like to further discuss these options with his family and meet up again in a few weeks for more discussion.    10/22/2020  Tracy Mcmillan is following up with a new CT scan of his head.  Overall that imaging shows stable appearance to the increased left frontal region increased subdural space.  This overall does not appear to have any acute hemorrhage or increase in size from his previous imaging.  This may just represent a CSF hygroma related to general cerebral volume loss.  The patient has been doing well without any significant symptoms.    We also discussed at this time that his previous MRA imaging showed evidence of a anterior communicating artery aneurysm.  This measures approximately 6 mm in size.  Overall we discussed that in general the short-term risk of the aneurysm is quite low as related to any rupture risk.  However, we discussed that a diagnostic cerebral angiogram would be beneficial in determining if there is any high risk morphological features related to the aneurysm.    However, we discussed that at this time we would also like a another delayed longer term CT scan to make sure that the subdural space is stable without evidence of any new hemorrhage or changes in size of the subdural space.  Because, if we do see that at that time of the diagnostic cerebral angiogram we would also potentially entertain performing a embolization of the middle meningeal artery for treatment of any subdural hematoma.    If the patient remained stable on that CT scan of the head then at that point we would plan for an outpatient diagnostic cerebral angiogram.  Therefore, we discussed waiting 3 months and then getting the new CT scan of the head at the time of evaluation for the angiogram as a way to limit the number of scans and visits for the patient.    09/17/2020  Tracy Mcmillan is a 76 year old male who was evaluated initially on July 08, 2020 when he was transferred to [facility] for evaluation of a left-sided subdural hematoma.  He does have a history of chronic migraines but was having different headaches at that time.  He did not have any preceding trauma.  He overall does not take any anticoagulation.  For his headaches however he was taking aspirin regularly.    Upon arrival he had CT scans performed as well as MRI and there were concerns for the left-sided increased fluid space in the subdural region.  There was no evidence of acute blood.  This may be related to a chronic subdural hematoma versus volume loss in the frontal lobes with increased subdural space.    Overall the patient is clinically been stable without any headaches for the last few months.  He was referred to my clinic and it initially appeared that the evaluation was for subdural hematoma.  However, in further review of the charts and imaging the patient did have an MRA performed that was transferred here which does show evidence of a approximately 6 mm anterior communicating artery region aneurysm.  This was not known at the time of the visit and the patient was unsure of any other reasons for follow-up for this appointment.    The plan at this point is to get a CT head  in approximately 1 month and revisit given the time since the initial evaluation for potential chronic subdural hematoma.  This may actually be advantageous to get the CT scan before any discussion about angiogram or further evaluation of the aneurysm given that if there is evidence of growth or changes of the left subdural space concerning for chronic subdural hematoma this may be amenable to endovascular treatment with middle meningeal artery embolization.  It would be better to have that information prior to any discussion of the aneurysm which overall is small in size and carries a low risk for any rupture.    Patient is a current smoker.       Review of Systems  Negative except HPI    Objective:         ? aspirin EC 81 mg tablet Take 81 mg by mouth daily. Take with food.   ? cholecalciferol (vitamin D3) (D3-2000 PO) Take 1 tablet by mouth daily.   ? Epinephrine Base 0.3 mg/0.3 mL syrg Inject  to area(s) as directed. Indications: a significant type of allergic reaction called anaphylaxis, bee venom allergy   ? fluticasone propionate (FLONASE) 50 mcg/actuation nasal spray, suspension Apply 2 sprays to each nostril as directed daily. Shake bottle gently before using.   ? gabapentin (NEURONTIN) 300 mg capsule Take 300 mg by mouth three times daily as needed.   ? levothyroxine (SYNTHROID) 50 mcg tablet Take 50 mcg by mouth daily 30 minutes before breakfast.   ? lisinopriL-hydrochlorothiazide (ZESTORETIC) 20-12.5 mg tablet Take 2 tablets by mouth daily.   ? loratadine (CLARITIN) 10 mg tablet Take 10 mg by mouth every morning.   ? omeprazole DR (PRILOSEC) 40 mg capsule Take 40 mg by mouth daily before breakfast.   ? oxyCODONE (ROXICODONE) 5 mg tablet Take one tablet by mouth every 6 hours as needed for Pain.   ? rosuvastatin (CRESTOR) 5 mg tablet Take 2.5 mg by mouth three times weekly.   ? SUMAtriptan succinate (IMITREX) 50 mg tablet Take 50 mg by mouth once. Take one tablet by mouth at onset of headache. May repeat after 2 hours if needed. Max of 200 mg in 24 hours.     There were no vitals filed for this visit.  There is no height or weight on file to calculate BMI.     Physical Exam   Over teleconference  Awake, alert, oriented  Face symmetric  Speech regular rhythm rate and tone  Lifts both arms above head difficulty    Encounter Diagnoses   Name Primary?   ? Cerebral aneurysm Yes            Imaging:    07/08/20 CT head      07/08/20 MRA head - acomm region aneurysm        10/22/20 CT head      01/19/21 CT head  Stable left subdural hygroma        01/19/21 Cerebral angiogram - acomm aneurysm              Assessment and Plan:    Bardia is a 76 year old male who was initially seen for a subdural hematoma.  He was transferred to [facility] for evaluation of this.  In review of his charts and images from the [place] it was evident that he does have a MRA showing a anterior communicating artery aneurysm.  The patient was unsure of this at the time of the visit and the notes and imaging was reviewed after the patient had  left the clinic.      left Subdural hematoma/hygroma    1.  Left subdural fluid collection  - This appears to be a hygroma without any evidence of changes over serial imaging.    2.  Anterior communicating artery aneurysm    - We discussed overall the recommendation for treatment of the aneurysm based on the neuro vascular conference recommendations.  We do feel that the patient would be a good candidate for a couple of our clinical trials looking at treatment of wide neck bifurcation aneurysms.  We overall discussed that the short-term risks of hemorrhage from the aneurysm overall are low as the patient would like more time to think about the options that we discussed to say and see if he is interested in treatment.    - We also discussed that the patient would likely be placed on dual antiplatelet therapy around the time of any treatment with the plans and initial thoughts of treating the aneurysm from an intra saccular standpoint without having any stents or treatment requiring placement of devices within the vessels.  However, we would plan to go in with the idea that if we are treating the aneurysm that this would potentially be our backup plan of placing a stent and coils for treatment of the aneurysm which would require dual antiplatelet therapy.    - Again we discussed the risk benefits and alternatives to treatment versus observation.  We also discussed the plans around any endovascular procedure and likely course of treatment if everything goes well being in the hospital for a night and then home the next day with no major restrictions shortly after.    - At this point we are going to set up a follow-up appointment in 1 month with the patient to further discuss and answer any questions at that time.  If prior to this he decides he would like to undergo treatment he will contact us and we will start the process of screening and evaluation for any worse study protocols.    - If down the road we decide to go just conservative with monitoring the aneurysm we would plan to get a CT angiogram in 1 year after his last evaluation.             Zoom Consent:  This visit was completed via Zoom due to the restrictions of the COVID-19 pandemic. All issues as below were discussed and addressed but no physical exam was performed unless allowed by visual confirmation on Zoom. If it was felt that the patient should be evaluated in clinic then they were directed there. Patient verbally consented to visit.   Obtained patient's verbal consent to treat them and their agreement to [facility] financial policy and NPP via this telehealth visit during the [facility]          Video Time Documentation:  Spent 28 minutes with pt face to face and more that 50% of this time was spent in counseling and coordination of care.

## 2021-03-29 ENCOUNTER — Encounter: Admit: 2021-03-29 | Discharge: 2021-03-29 | Payer: MEDICARE

## 2021-04-18 ENCOUNTER — Encounter: Admit: 2021-04-18 | Discharge: 2021-04-18 | Payer: No Typology Code available for payment source

## 2021-04-18 NOTE — Telephone Encounter
Received call from RN at patient's PCP at the [place]. RN at [facility] stated patient informed PCP office of possible upcoming aneursym treatment. Patient reported prior complication with anesthesia to PCP, and RN at [facility] found records of laryngeal spasm during an EGD at [facility] in August, 2022. Due to this finding, PCP placed referral to ENT. Will obtain records once patient sees ENT and inform Dr. Vonita Moss.

## 2021-05-06 ENCOUNTER — Ambulatory Visit: Admit: 2021-05-06 | Discharge: 2021-05-07 | Payer: No Typology Code available for payment source

## 2021-05-06 ENCOUNTER — Encounter: Admit: 2021-05-06 | Discharge: 2021-05-06 | Payer: No Typology Code available for payment source

## 2021-05-06 DIAGNOSIS — E782 Mixed hyperlipidemia: Secondary | ICD-10-CM

## 2021-05-06 DIAGNOSIS — M1990 Unspecified osteoarthritis, unspecified site: Secondary | ICD-10-CM

## 2021-05-06 DIAGNOSIS — I671 Cerebral aneurysm, nonruptured: Secondary | ICD-10-CM

## 2021-05-06 DIAGNOSIS — J449 Chronic obstructive pulmonary disease, unspecified: Secondary | ICD-10-CM

## 2021-05-06 DIAGNOSIS — F17200 Nicotine dependence, unspecified, uncomplicated: Secondary | ICD-10-CM

## 2021-05-06 DIAGNOSIS — M5137 Other intervertebral disc degeneration, lumbosacral region: Secondary | ICD-10-CM

## 2021-05-06 DIAGNOSIS — F32A Depression: Secondary | ICD-10-CM

## 2021-05-06 DIAGNOSIS — G44029 Chronic cluster headache, not intractable: Secondary | ICD-10-CM

## 2021-05-06 DIAGNOSIS — F4312 Post-traumatic stress disorder, chronic: Secondary | ICD-10-CM

## 2021-05-06 DIAGNOSIS — K2270 Barrett's esophagus without dysplasia: Secondary | ICD-10-CM

## 2021-05-06 DIAGNOSIS — G4733 Obstructive sleep apnea (adult) (pediatric): Secondary | ICD-10-CM

## 2021-05-06 DIAGNOSIS — F1291 Marijuana use in remission: Secondary | ICD-10-CM

## 2021-05-06 DIAGNOSIS — E039 Hypothyroidism, unspecified: Secondary | ICD-10-CM

## 2021-05-06 DIAGNOSIS — Z9981 Dependence on supplemental oxygen: Secondary | ICD-10-CM

## 2021-05-06 DIAGNOSIS — N400 Enlarged prostate without lower urinary tract symptoms: Secondary | ICD-10-CM

## 2021-05-06 DIAGNOSIS — T8859XA Other complications of anesthesia, initial encounter: Secondary | ICD-10-CM

## 2021-05-06 DIAGNOSIS — M5030 Other cervical disc degeneration, unspecified cervical region: Secondary | ICD-10-CM

## 2021-05-06 DIAGNOSIS — K219 Gastro-esophageal reflux disease without esophagitis: Secondary | ICD-10-CM

## 2021-05-06 DIAGNOSIS — M542 Cervicalgia: Secondary | ICD-10-CM

## 2021-05-06 DIAGNOSIS — N200 Calculus of kidney: Secondary | ICD-10-CM

## 2021-05-06 DIAGNOSIS — Z8719 Personal history of other diseases of the digestive system: Secondary | ICD-10-CM

## 2021-05-06 DIAGNOSIS — I82409 Acute embolism and thrombosis of unspecified deep veins of unspecified lower extremity: Secondary | ICD-10-CM

## 2021-05-06 DIAGNOSIS — E559 Vitamin D deficiency, unspecified: Secondary | ICD-10-CM

## 2021-05-06 DIAGNOSIS — I10 Essential (primary) hypertension: Secondary | ICD-10-CM

## 2021-05-06 DIAGNOSIS — R7301 Impaired fasting glucose: Secondary | ICD-10-CM

## 2021-05-06 NOTE — Progress Notes
Date of Service: 05/06/2021    Subjective:             Tracy Mcmillan is a 76 y.o. 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  He was initially evaluated in April by Dr. Sena Hitch.    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  We again discussed that overall his apprehension to treatment earlier related to his COPD and concerns around time of intubation.  However, we discussed that at this point we feel like he likely would not have any significant issues of this and he would likely build to be extubated.    He is leaning towards treatment of the aneurysm at this point.  We discussed with our research team and overall based on his COPD he is not a candidate for any of our trial studies right now looking at intra saccular devices.    At this point if we are discussing treatment then we will plan for web treatment of the aneurysm with plans for stent assisted coiling as a backup at that time if the web does not appear to sit well in the aneurysm.    We discussed this with him and at this time he would like to follow-up in 1 more month for a little bit further discussion about the web device and we will go from there for any surgical planning.  We also discussed and will discuss again in the future that he will likely be on dual antiplatelet therapy around the time of the treatment in case we do need to perform stent assisted coiling.    03/25/2021  Tracy Mcmillan again is doing well and is present over telehealth visit with his family.  He has not had any significant changes since we last talked with him.  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  We also discussed that we feel like he is likely a candidate for one of our trial devices and we would screen him for this if we did undergo evaluation for treatment.  We also discussed that he would likely need to be on dual antiplatelet therapy at least around the time of treatment in case it required stent assisted coiling.  We discussed the risk benefits and alternatives to this type of endovascular treatment.  We also discussed the likely treatment course if everything goes well he would likely be in the hospital for 1 day and plans for discharge home after that.    We also discussed that overall he does have some history of difficulties with anesthesia for a minor procedure and they discussed that he would need to be at the hospital for any more of his procedures at that time.  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  We did discuss that prior to procedure he would be evaluated by our anesthesia service and they would determine his overall risks and if he needed any further work-up related to any perioperative care.  Overall though we feel like he tolerated the angiogram fine and that we did have ileus low concerns at this point of performing the procedure.    We also discussed that we would try to get him in contact with  our financial counseling as they are submitting his stuff to Medicare and he is a [place] patient and some of this should be filtered through them and we discussed that we would help facilitate whenever we need to from a financial standpoint.    At this point again the patient would like a little bit more time thinking about the options.  We will plan to follow-up again in 1 month for further discussion and to answer any questions.  If prior to this the patient should decide that he would like to pursue further treatment of the aneurysm we at that point we will engage the research team for final screening and evaluation of any of our study protocols that would fit for the patient's aneurysm.    Otherwise we also discussed that if he decides that he would rather just go conservative management with observation that we would plan for at least a CT angiogram 1 year after his last evaluation for continued follow-up of the aneurysm.      02/25/21  Tracy Mcmillan is overall doing well.  At this time we discussed the angiogram findings with him and his son.  He has questions about the overall plans for potential treatment.  We discussed that overall the recommendation from conference was treatment of his aneurysm which given its size and configuration has a overall phases score of 9 with a 4.3% risk of rupture every 5 years.    Given the location and configuration we do feel like this would be amenable to one of our study devices that we are currently evaluating.  At this point the patient would like to further discuss these options with his family and meet up again in a few weeks for more discussion.    10/22/2020  Tracy Mcmillan is following up with a new CT scan of his head.  Overall that imaging shows stable appearance to the increased left frontal region increased subdural space.  This overall does not appear to have any acute hemorrhage or increase in size from his previous imaging.  This may just represent a CSF hygroma related to general cerebral volume loss.  The patient has been doing well without any significant symptoms.    We also discussed at this time that his previous MRA imaging showed evidence of a anterior communicating artery aneurysm.  This measures approximately 6 mm in size.  Overall we discussed that in general the short-term risk of the aneurysm is quite low as related to any rupture risk.  However, we discussed that a diagnostic cerebral angiogram would be beneficial in determining if there is any high risk morphological features related to the aneurysm.    However, we discussed that at this time we would also like a another delayed longer term CT scan to make sure that the subdural space is stable without evidence of any new hemorrhage or changes in size of the subdural space.  Because, if we do see that at that time of the diagnostic cerebral angiogram we would also potentially entertain performing a embolization of the middle meningeal artery for treatment of any subdural hematoma.    If the patient remained stable on that CT scan of the head then at that point we would plan for an outpatient diagnostic cerebral angiogram.  Therefore, we discussed waiting 3 months and then getting the new CT scan of the head at the time of evaluation for the angiogram as a way to limit the number of scans and visits for the patient.  09/17/2020  Tracy Mcmillan is a 76 year old male who was evaluated initially on July 08, 2020 when he was transferred to [facility] for evaluation of a left-sided subdural hematoma.  He does have a history of chronic migraines but was having different headaches at that time.  He did not have any preceding trauma.  He overall does not take any anticoagulation.  For his headaches however he was taking aspirin regularly.    Upon arrival he had CT scans performed as well as MRI and there were concerns for the left-sided increased fluid space in the subdural region.  There was no evidence of acute blood.  This may be related to a chronic subdural hematoma versus volume loss in the frontal lobes with increased subdural space.    Overall the patient is clinically been stable without any headaches for the last few months.  He was referred to my clinic and it initially appeared that the evaluation was for subdural hematoma.  However, in further review of the charts and imaging the patient did have an MRA performed that was transferred here which does show evidence of a approximately 6 mm anterior communicating artery region aneurysm.  This was not known at the time of the visit and the patient was unsure of any other reasons for follow-up for this appointment.    The plan at this point is to get a CT head in approximately 1 month and revisit given the time since the initial evaluation for potential chronic subdural hematoma.  This may actually be advantageous to get the CT scan before any discussion about angiogram or further evaluation of the aneurysm given that if there is evidence of growth or changes of the left subdural space concerning for chronic subdural hematoma this may be amenable to endovascular treatment with middle meningeal artery embolization.  It would be better to have that information prior to any discussion of the aneurysm which overall is small in size and carries a low risk for any rupture.    Patient is a current smoker.       Review of Systems  Negative except HPI    Objective:         ? aspirin EC 81 mg tablet Take one tablet by mouth daily. Take with food.   ? cholecalciferol (vitamin D3) (D3-2000 PO) Take 1 tablet by mouth daily.   ? Epinephrine Base 0.3 mg/0.3 mL syrg Inject  to area(s) as directed. Indications: a significant type of allergic reaction called anaphylaxis, bee venom allergy   ? fluticasone propionate (FLONASE) 50 mcg/actuation nasal spray, suspension Apply two sprays to each nostril as directed daily. Shake bottle gently before using.   ? gabapentin (NEURONTIN) 300 mg capsule Take one capsule by mouth three times daily as needed.   ? levothyroxine (SYNTHROID) 50 mcg tablet Take one tablet by mouth daily 30 minutes before breakfast.   ? lisinopriL-hydrochlorothiazide (ZESTORETIC) 20-12.5 mg tablet Take two tablets by mouth daily.   ? loratadine (CLARITIN) 10 mg tablet Take one tablet by mouth every morning.   ? omeprazole DR (PRILOSEC) 40 mg capsule Take one capsule by mouth daily before breakfast.   ? oxyCODONE (ROXICODONE) 5 mg tablet Take one tablet by mouth every 6 hours as needed for Pain.   ? rosuvastatin (CRESTOR) 5 mg tablet Take one-half tablet by mouth three times weekly.   ? SUMAtriptan succinate (IMITREX) 50 mg tablet Take one tablet by mouth once. Take one tablet by mouth at onset of headache. May repeat after 2  hours if needed. Max of 200 mg in 24 hours.     There were no vitals filed for this visit.  There is no height or weight on file to calculate BMI.     Physical Exam   Over teleconference  Awake, alert, oriented  Face symmetric  Speech regular rhythm rate and tone  Lifts both arms above head difficulty    Encounter Diagnoses   Name Primary?   ? Cerebral aneurysm Yes            Imaging:    07/08/20 CT head      07/08/20 MRA head - acomm region aneurysm        10/22/20 CT head      01/19/21 CT head  Stable left subdural hygroma        01/19/21 Cerebral angiogram - acomm aneurysm              Assessment and Plan:    Tracy Mcmillan is a 76 year old male who was initially seen for a subdural hematoma.  He was transferred to [facility] for evaluation of this.  In review of his charts and images from the [place] it was evident that he does have a MRA showing a anterior communicating artery aneurysm.  The patient was unsure of this at the time of the visit and the notes and imaging was reviewed after the patient had left the clinic.      left Subdural hematoma/hygroma    1.  Left subdural fluid collection  - This appears to be a hygroma without any evidence of changes over serial imaging.    2.  Anterior communicating artery aneurysm    - At this point the patient is leaning towards endovascular treatment of the aneurysm.  We did check and he is not a candidate for any of our trial devices.    - At this point he would like to come back in a few weeks to further discuss the plans of the web device and potential for stent assisted coiling if this not work at the time of treatment.  We will plan to follow him up at this time and then if he is interested we will get him set up and scheduled for treatment after that.                 Video Time Documentation:  Spent 24 minutes with pt face to face and more that 50% of this time was spent in counseling and coordination of care.

## 2021-05-20 ENCOUNTER — Ambulatory Visit: Admit: 2021-05-20 | Discharge: 2021-05-21 | Payer: No Typology Code available for payment source

## 2021-05-20 ENCOUNTER — Encounter: Admit: 2021-05-20 | Discharge: 2021-05-20 | Payer: No Typology Code available for payment source

## 2021-05-20 DIAGNOSIS — I671 Cerebral aneurysm, nonruptured: Secondary | ICD-10-CM

## 2021-05-20 NOTE — Patient Instructions
It was a pleasure seeing you today in the neurosurgery clinic via Telehealth. Dr. Vonita Moss would like to see you back in clinic in 3 months to discuss treatment options.  Our scheduling team should contact you to schedule your appointments.     If you have any questions or concerns, please don't hesitate to call me at [phone] or send a message via MyChart.  Thank you and take care!     Mertie Clause RN, BSN  Clinical Nurse Coordinator  Dr. Consuela Mimes [facility] Phillips Odor, APRN-NP  [facility]  Phone: [phone]  Fax: [phone]

## 2021-05-23 IMAGING — CR L-SPINE 4 VWS MIN
4 series · 4 of 4 positions shown · non-contrast
Comparison: February 01, 2021.

REASON FOR EXAM: Low back pain.

[w lumbar spine ap]
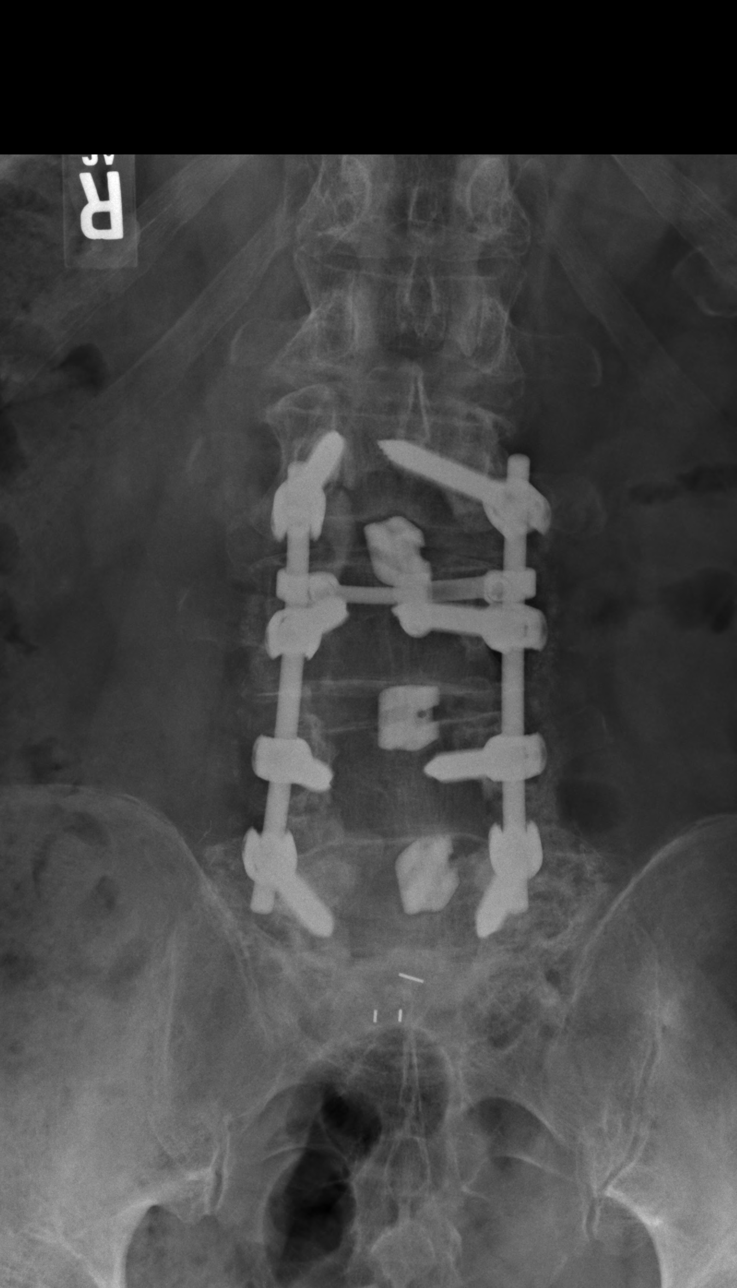

[w lumbar spine lat]
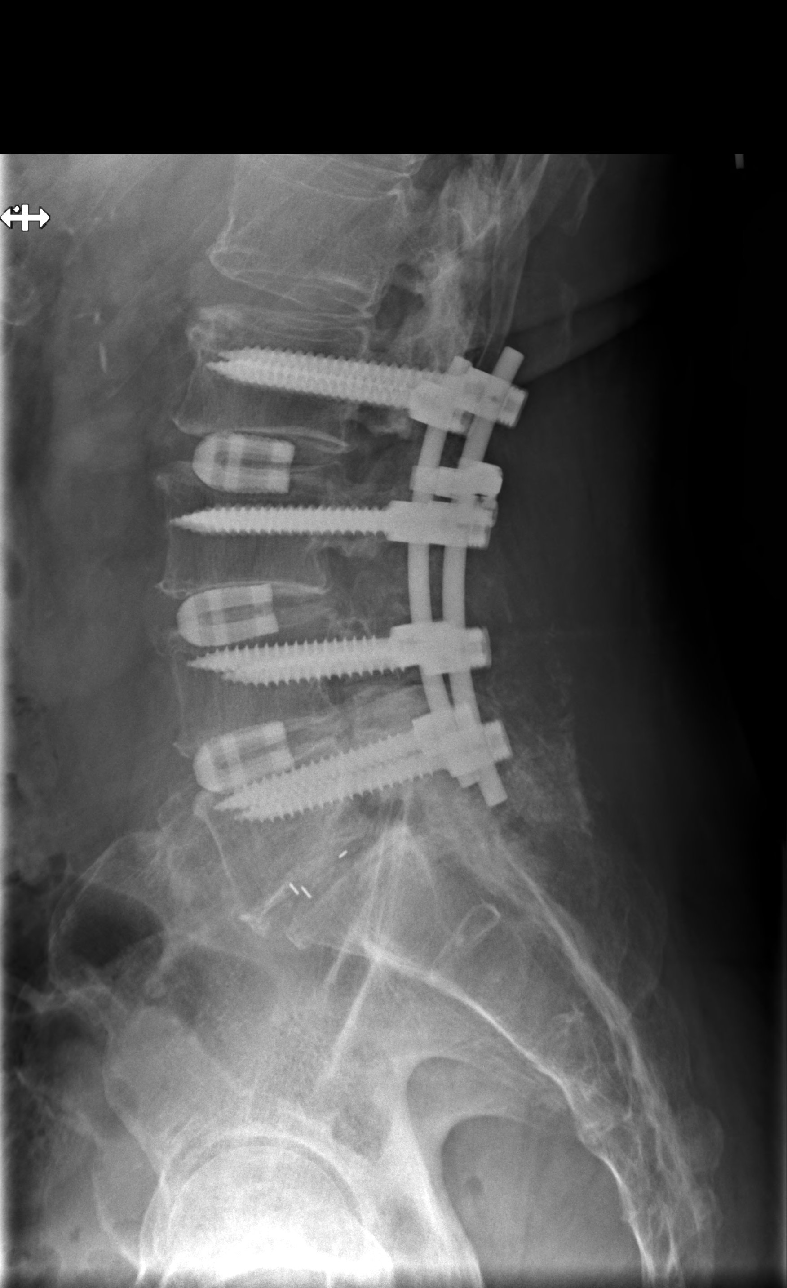

[w lumbar spine flexion]
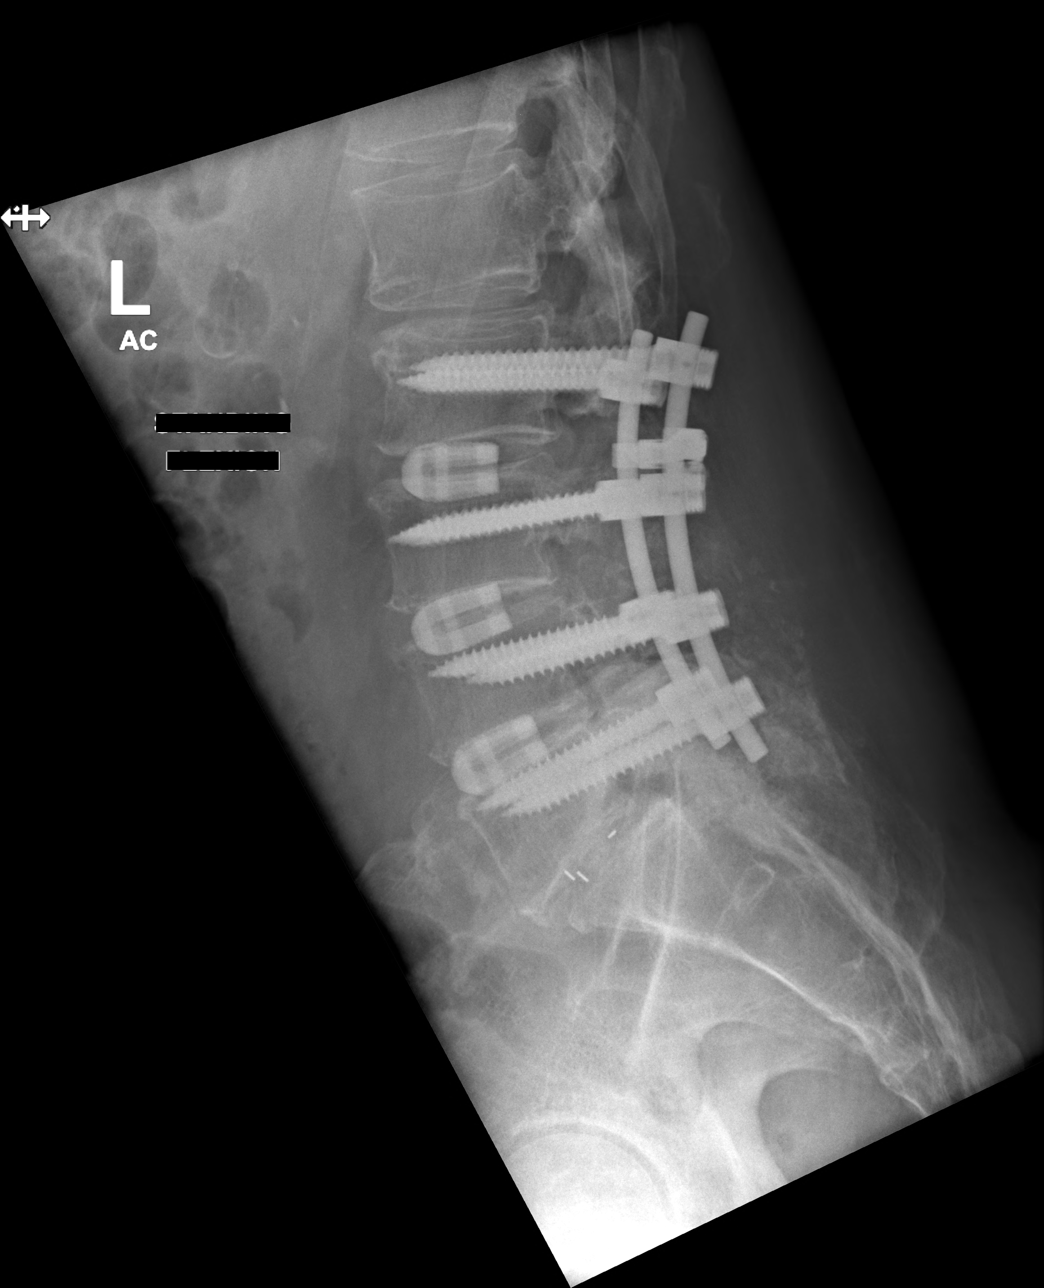

[w lumbar spine extension]
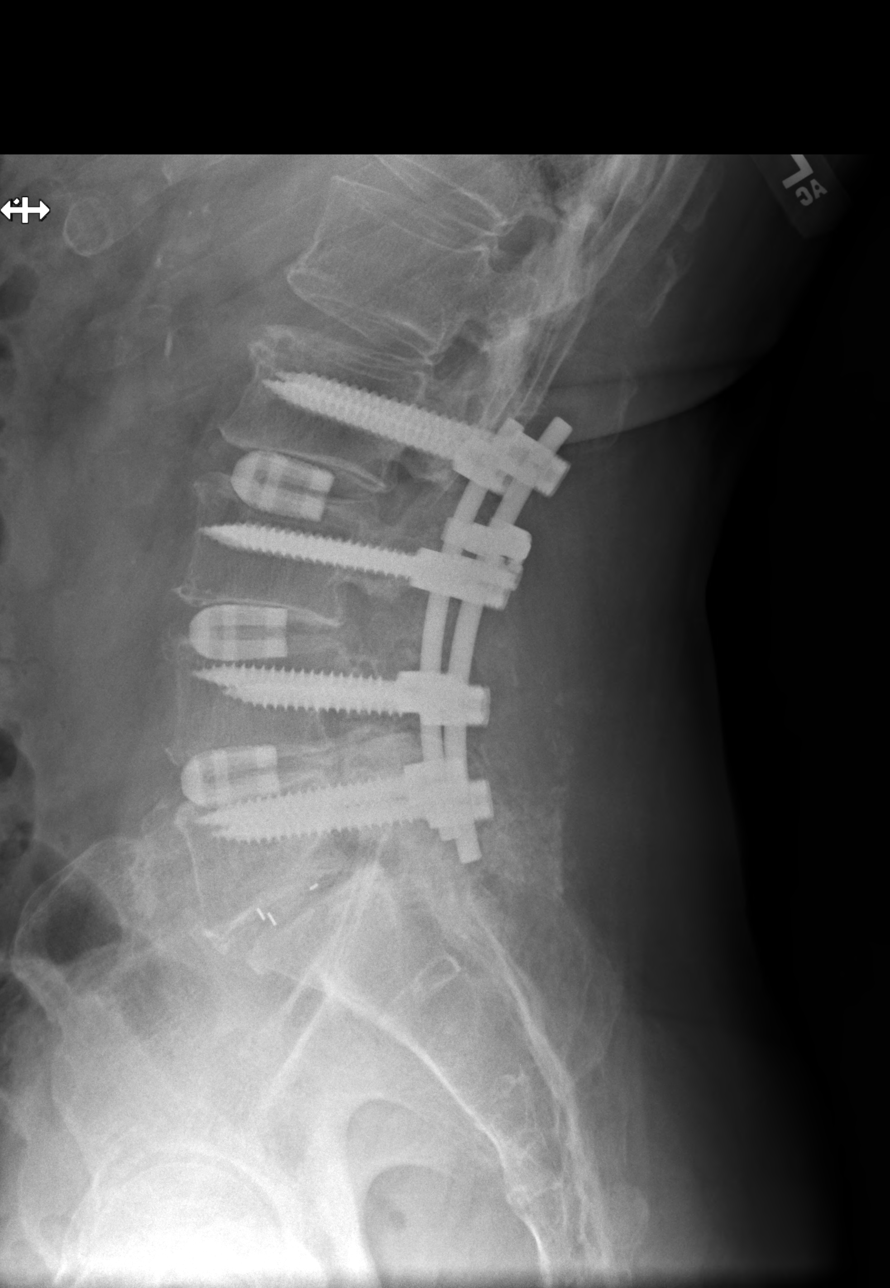

[4 of 4 positions shown; findings below may reference images not displayed]

TECHNIQUE AND FINDINGS: 4 views of the lumbar spine including flexion and extension lateral views show 5 lumbar-type vertebral bodies. The bones are osteopenic. The patient is status post posterior fusion from L2 through L5 with transpedicular screws, vertical and horizontal fixation rods and disc spacer material. In the neutral position there is slight retrolisthesis of L2 on L3. There is 15% anterolisthesis of L5 on S1. Disc spacer material is seen at L5-S1 as well. With flexion and extension views, there is no evidence of instability.
IMPRESSION: Stable exam showing chronic and postsurgical changes.

## 2021-07-11 IMAGING — CT CT CHEST HIGH RESOLUTION WO CONTRAST
3 of 5 series · 14 of 36 positions shown, 15 images · non-contrast
Comparison: none

[Series 3: supine chest inspiration soft tissue · axial · 0.74mm/px · z∈[-1335,-1085]mm · 7 of 76 slices shown]
[im 9/76  mediastinal]
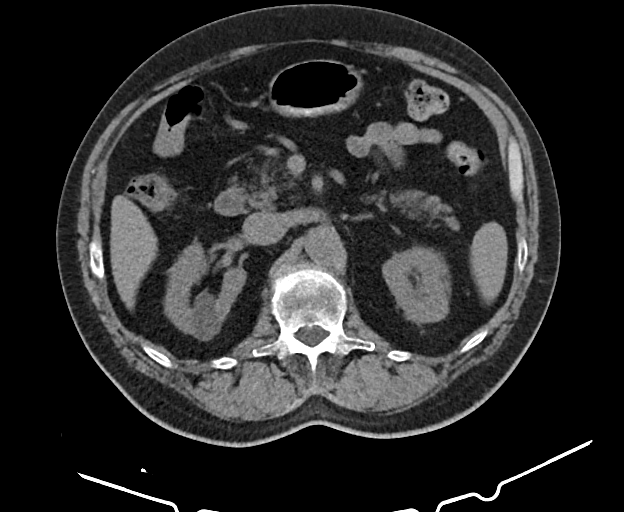
[im 17/76  mediastinal]
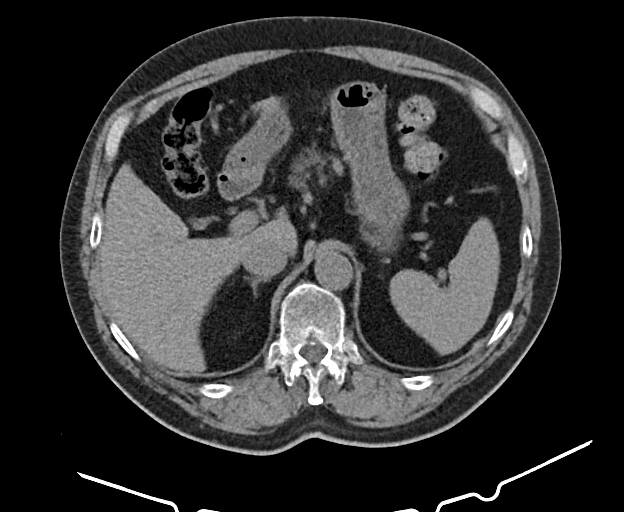
[im 26/76  mediastinal]
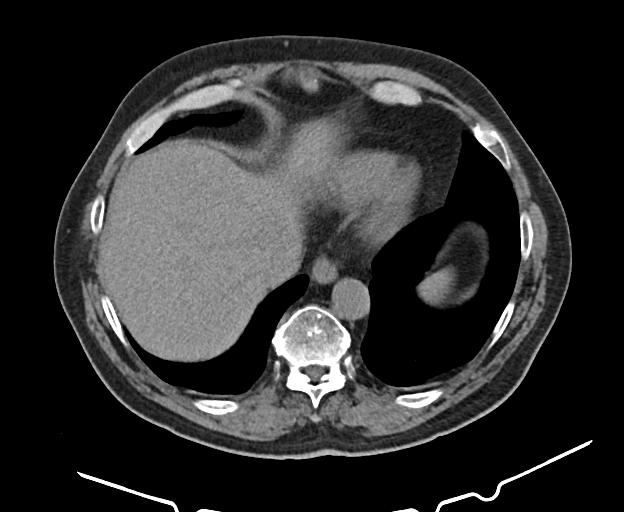
[im 32/76  mediastinal]
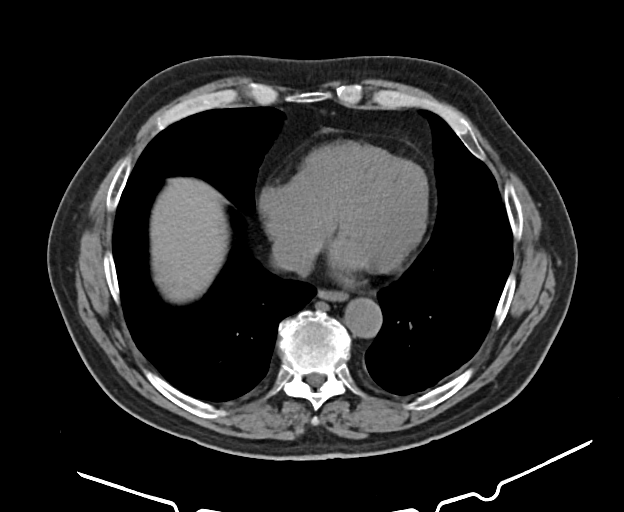
[im 42/76  mediastinal]
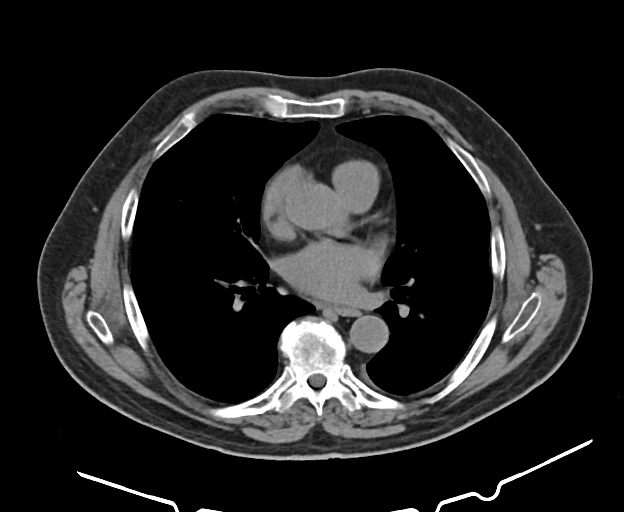
[im 51/76  mediastinal]
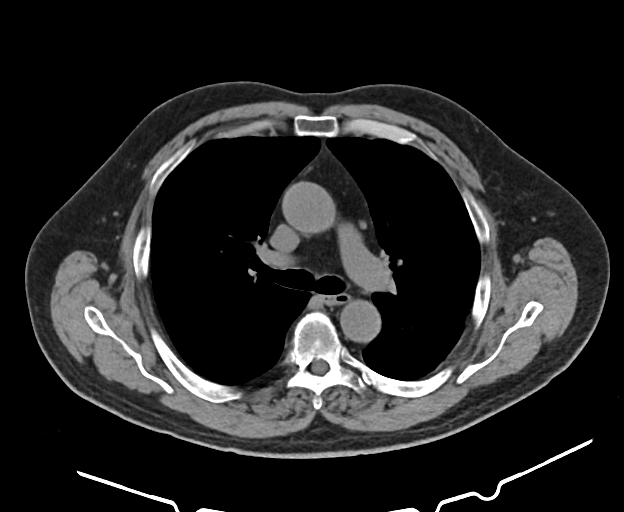
[im 59/76  mediastinal]
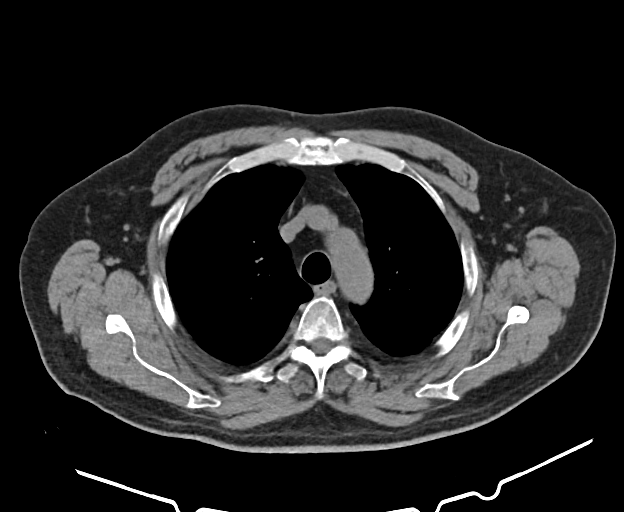

[Series 7: coronal · coronal · 0.75mm/px · 3 of 190 slices shown]
[im 38/190  lung]
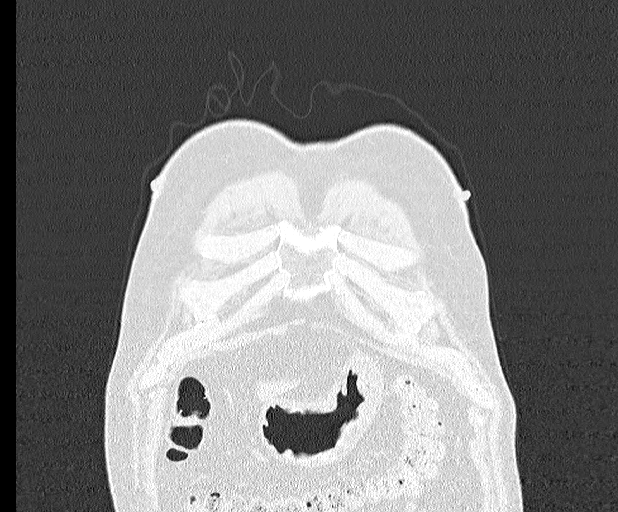
[im 76/190  lung]
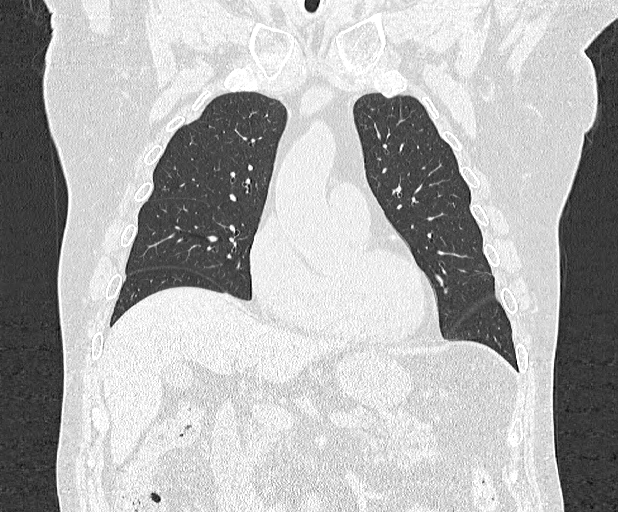
[im 114/190  lung]
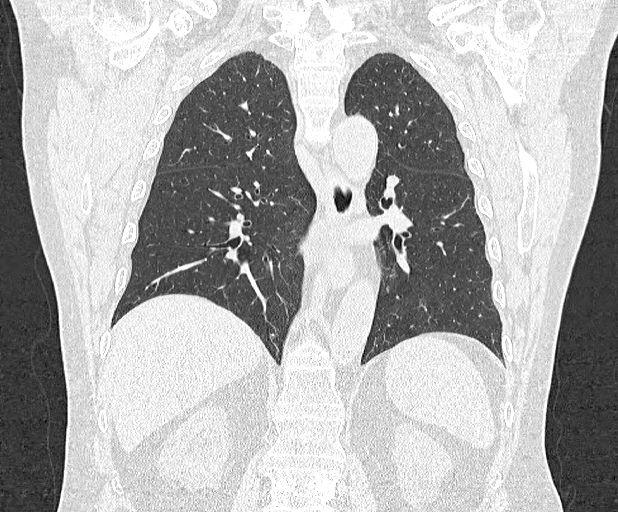

[Series 14: supine chest expir lung · axial · 0.62mm/px · z∈[-1298,-1118]mm · 4 of 19 slices shown, 5 images]
[im 5/19  mediastinal]
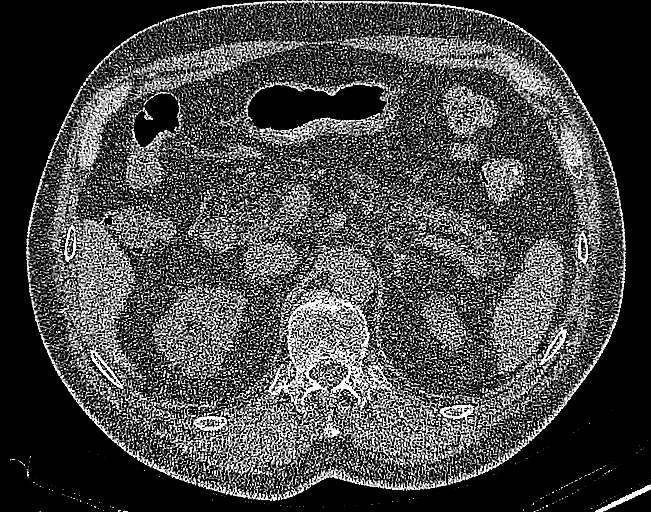
[im 5/19  lung]
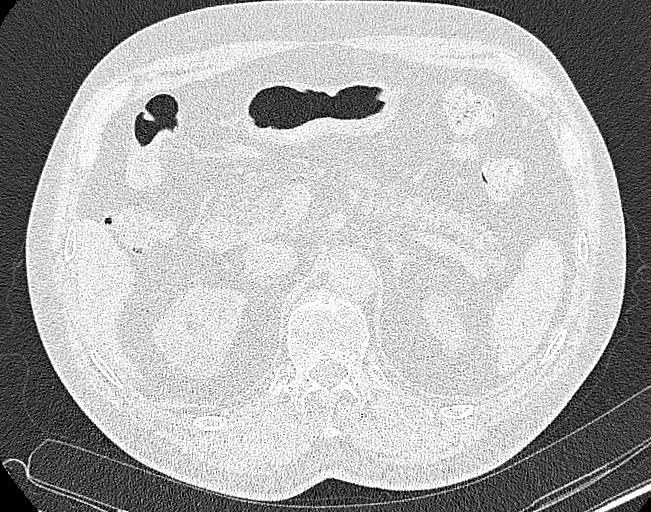
[im 9/19  lung]
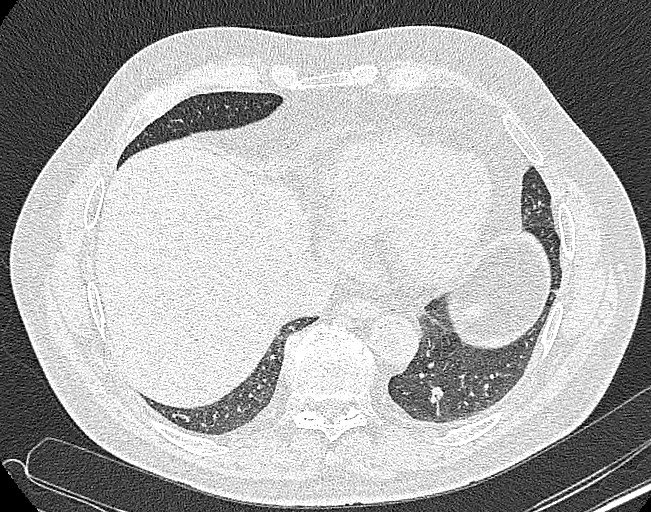
[im 10/19  lung]
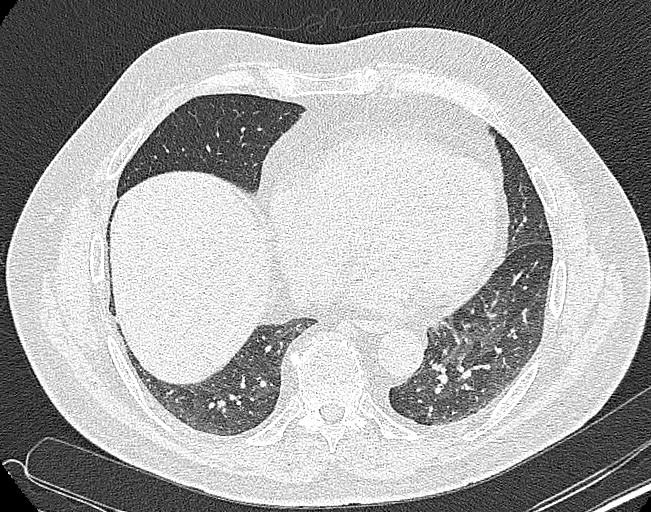
[im 14/19  lung]
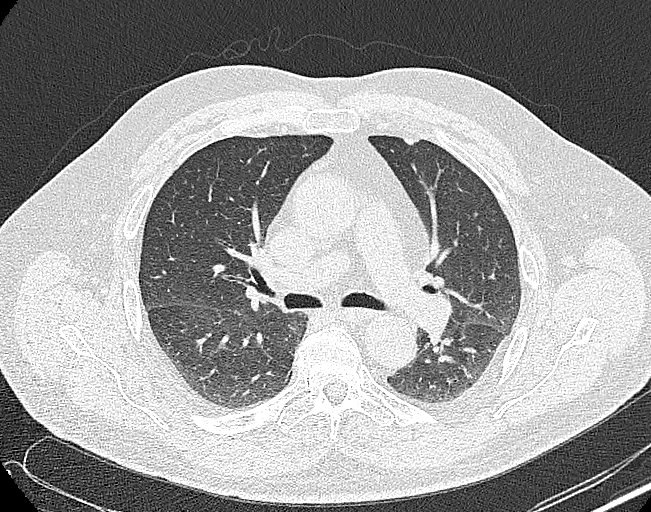

[14 of 36 positions shown; findings below may reference images not displayed]

REASON FOR EXAM

*
Contact with and (suspected) exposure to other hazardous substances

COMPARISON

*
None

TECHNIQUE

*
CT images of the chest were acquired without IV contrast

*
Thin section axial images were reconstructed from the source data (lung and soft tissue windows)

*
Additional 1 mm axial images of the chest were acquired at a larger interval during expiration, then repeated at inspiration in the prone position

*
Total radiation dose to patient is CTDIvol 16.06 mGy and DLP 670.00 mGy-cm.

*
Dose reduction technique used: Automated exposure control and adjustment of the mA and/or kV according to patient size

*
  CT Studies and Cardiac Nuclear Medicine Studies in last 12-months = 1

FINDINGS

Mediastinum and Thoracic Inlet

*
Small hiatal hernia

*
Unremarkable esophagus

*
No enlarged lymph nodes

*
Borderline left atrial enlargement

*
Physiologic amount of pericardial fluid

Lungs and Airways

*
Patent central airways

*
Minimal linear scarring

*
Two punctate calcified granulomas in the right lung

*
No suspicious nodule or mass

*
Reticular markings are not increased

*
No honeycombing or interlobular septal thickening

*
No mosaic attenuation

Pleura

*
No effusion

*
No suspicious thickening or nodularity

Chest Wall and Superficial Tissues

*
Spondylosis with DISH

*
Postoperative findings in the lumbar spine

Upper Abdomen

*
Renal cysts

*
Nephrolithiasis

IMPRESSION

*
No suspicious pulmonary nodule or mass

*
No CT signs of malignancy on today's exam

## 2021-07-12 ENCOUNTER — Encounter: Admit: 2021-07-12 | Discharge: 2021-07-12 | Payer: No Typology Code available for payment source

## 2021-07-13 ENCOUNTER — Encounter: Admit: 2021-07-13 | Discharge: 2021-07-13 | Payer: No Typology Code available for payment source

## 2021-07-22 ENCOUNTER — Encounter: Admit: 2021-07-22 | Discharge: 2021-07-22 | Payer: No Typology Code available for payment source

## 2021-07-22 NOTE — Telephone Encounter
Patient calls in and states no longer has ride to [facility] 08/26/21 for MRA and appointment with Dr. Vonita Moss. Informed patient MRA could be done locally and appointment could be made Telehealth. Confirmed with patient preferred location. Order with authorization and [employer] insurance info faxed to [facility] to call to schedule patient for MRA prior to 08/26/21 appointment with Dr. Vonita Moss. Informed patient appointment would be scheduled as Telehealth. Denied further questions or concerns.

## 2021-08-04 ENCOUNTER — Encounter: Admit: 2021-08-04 | Discharge: 2021-08-04 | Payer: No Typology Code available for payment source

## 2021-08-11 IMAGING — CR CHEST 2 VWS PA LAT
2 series · 2 of 2 positions shown · non-contrast
Comparison: CT chest 07/11/2021

Images Obtained from [facility]
HISTORY: Encounter for other preprocedural examination
TECHNIQUE: PA and lateral chest radiographs

[w chest pa]
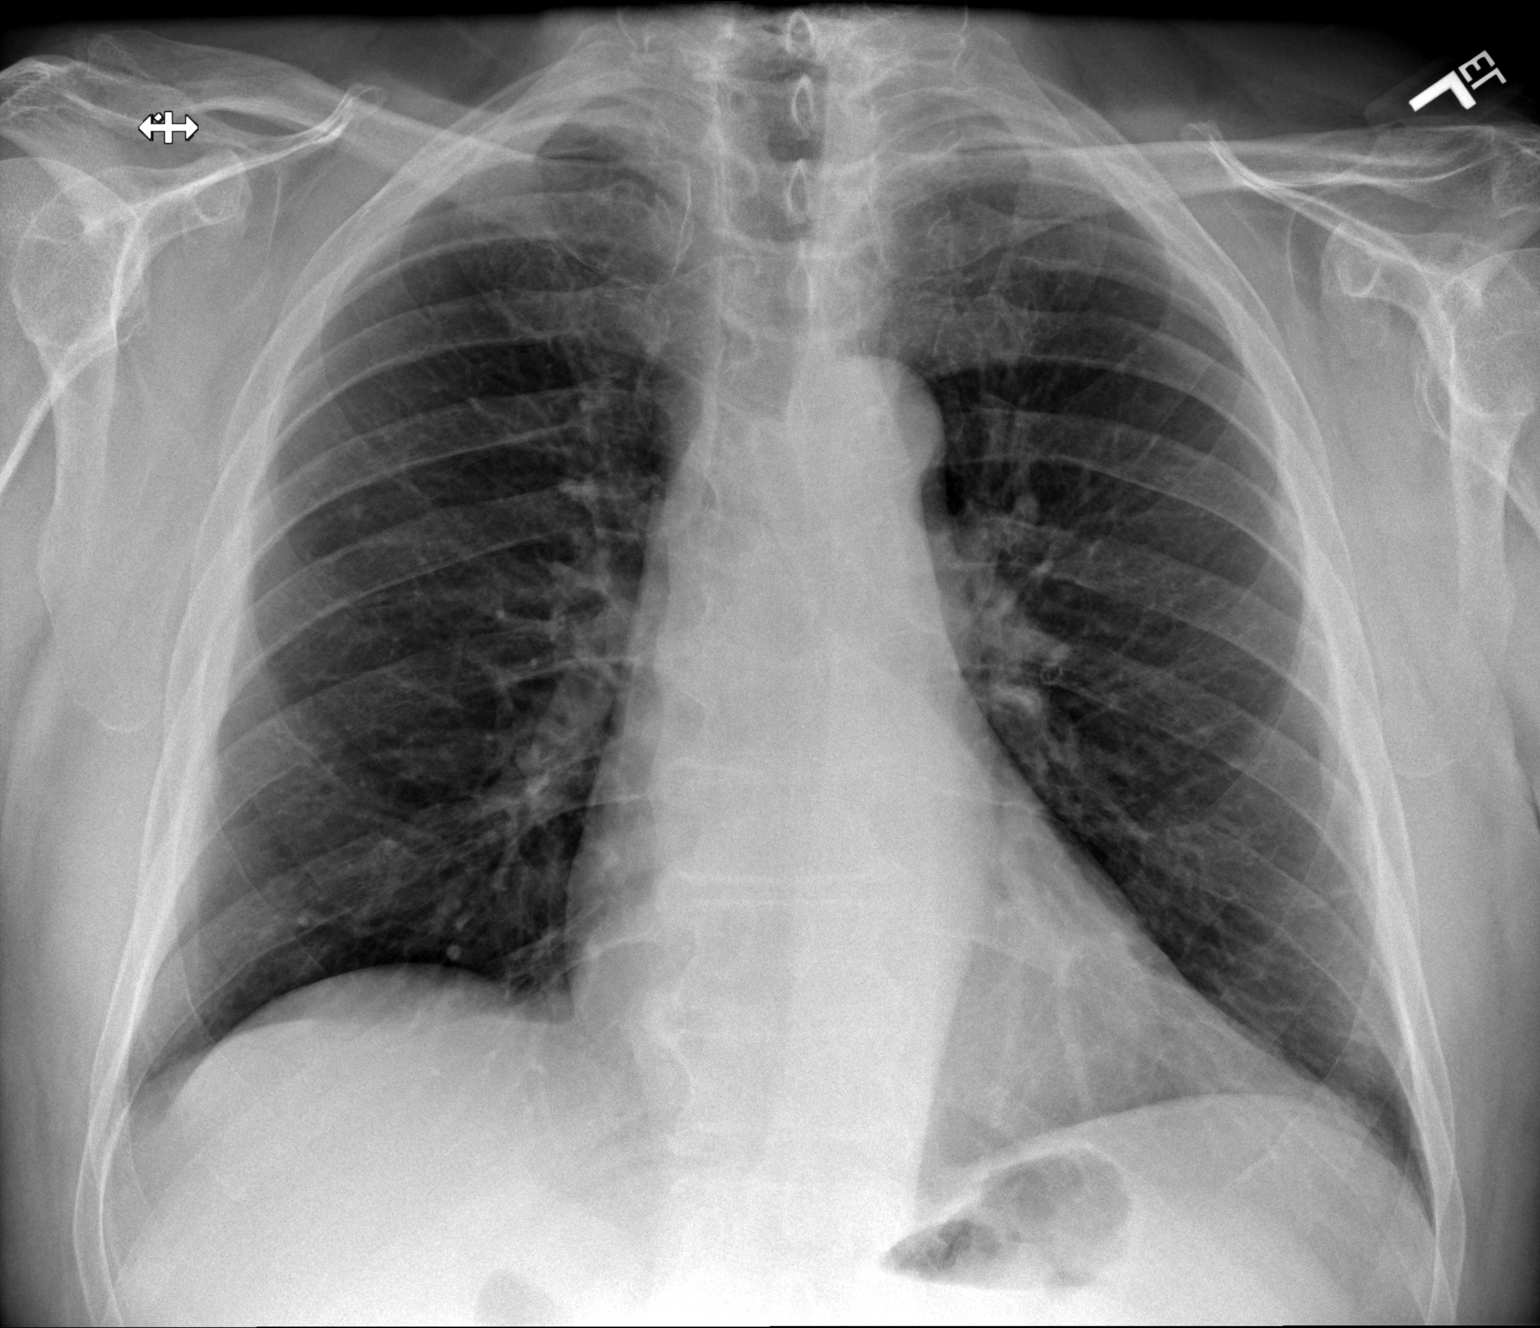

[w chest lat]
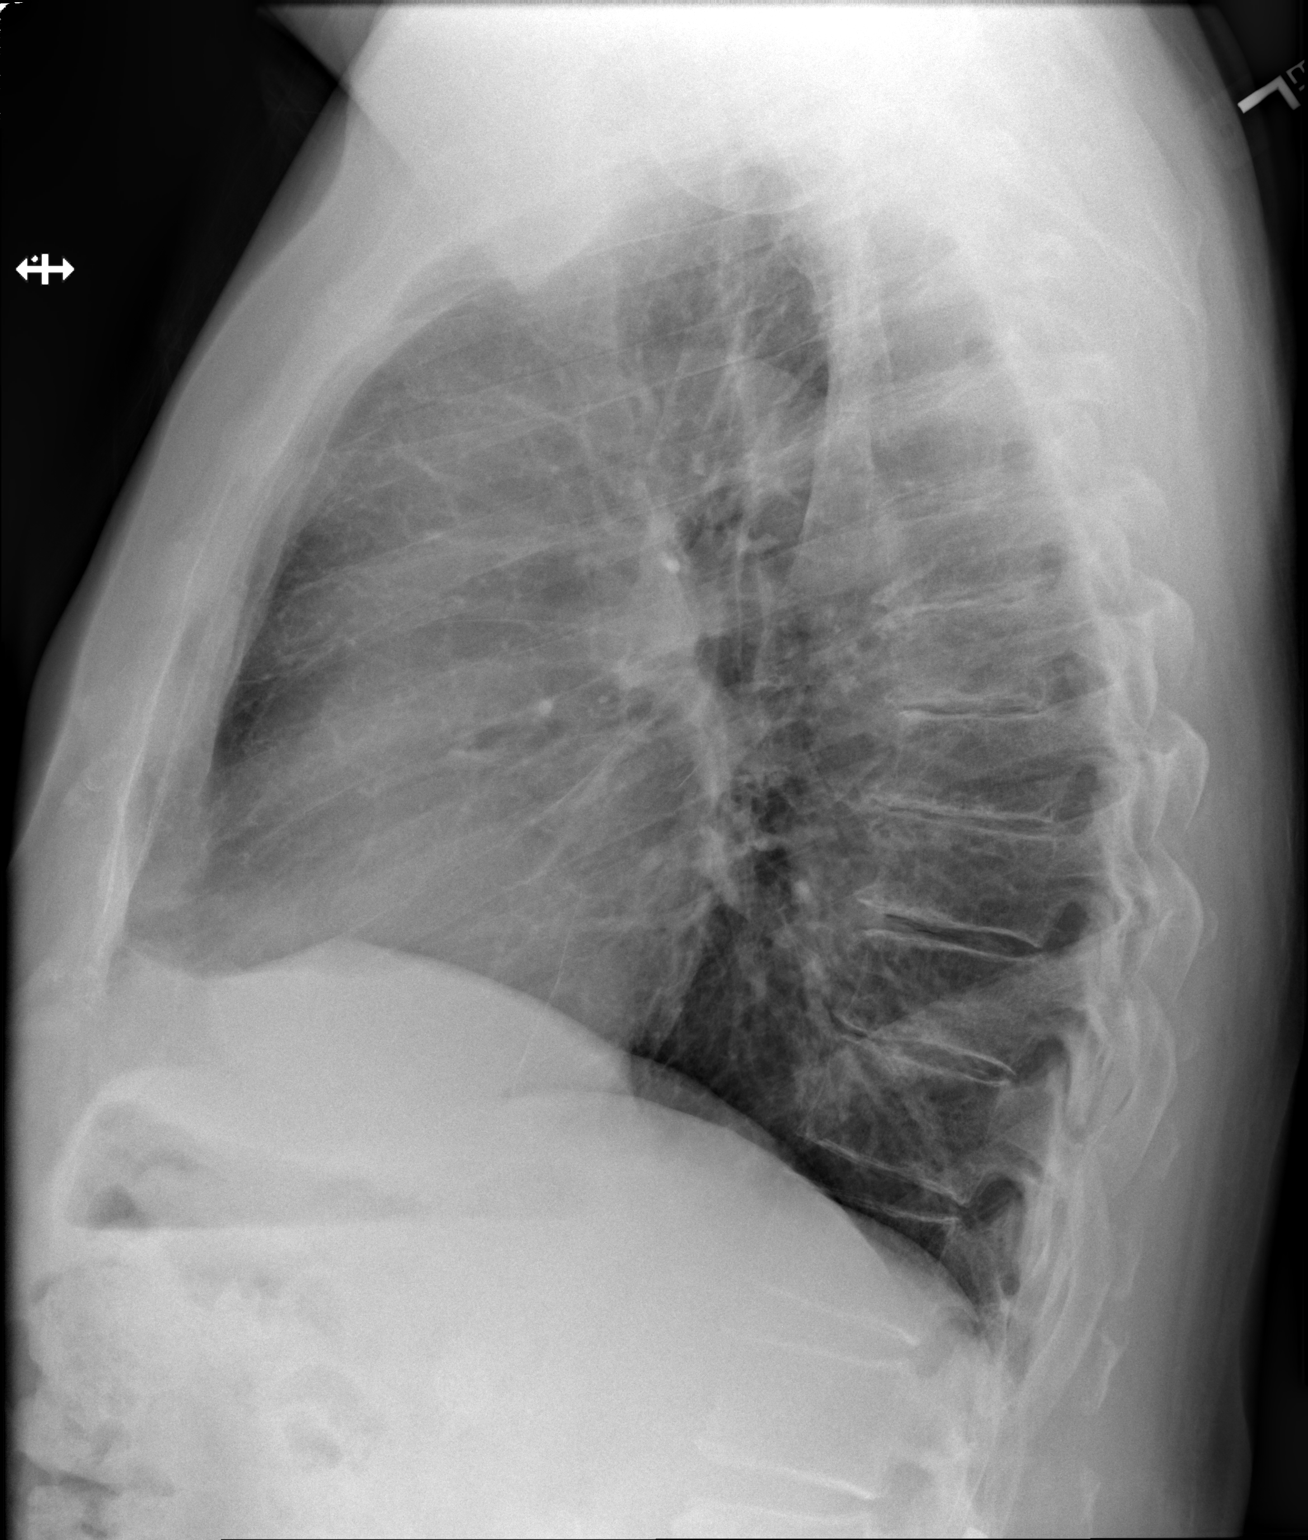

[2 of 2 positions shown; findings below may reference images not displayed]

FINDINGS: Heart size is normal. No pleural effusion or pneumothorax. No consolidation.
IMPRESSION: No acute chest findings.

## 2021-08-24 IMAGING — MR MRI LSPINE WO/W CONTRAST
9 of 11 series · 43 of 48 positions shown · IV contrast (prohance)
Comparison: Lumbar spine radiographs 05/23/2021

Images Obtained from [facility]
HISTORY: 75-year-old male. Low back pain with post-laminectomy syndrome.
TECHNIQUE: Multiplanar, multisequence MR images of the lumbar spine were obtained before and after administration of intravenous contrast.
CONTRAST: 20 cc of ProHance

[Series 1: bSSFP · axial · 8.0mm · 1.37mm/px · z∈[-61,+175]mm · 5 of 25 slices shown]
[im 1/25]
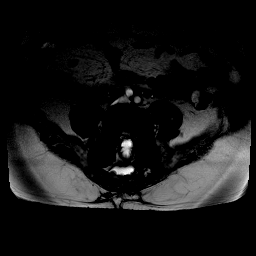
[im 7/25]
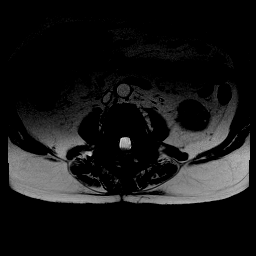
[im 13/25]
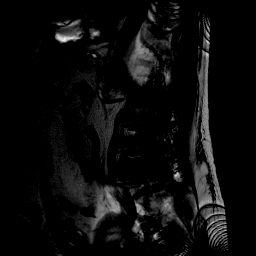
[im 19/25]
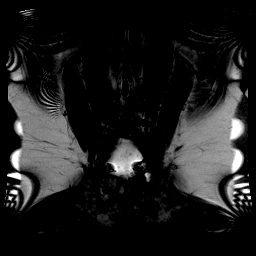
[im 25/25]
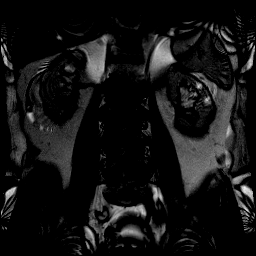

[Series 2: t2_sag_hbw · sagittal · 4.0mm · 0.38mm/px · 3 of 15 slices shown]
[im 1/15]
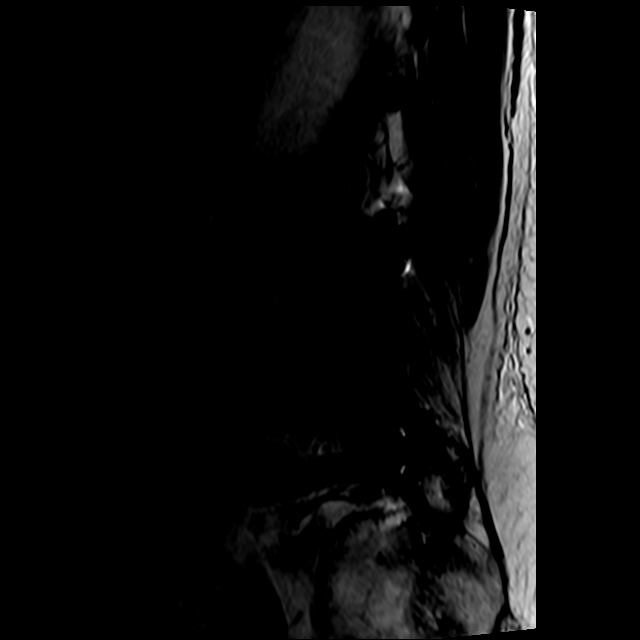
[im 8/15]
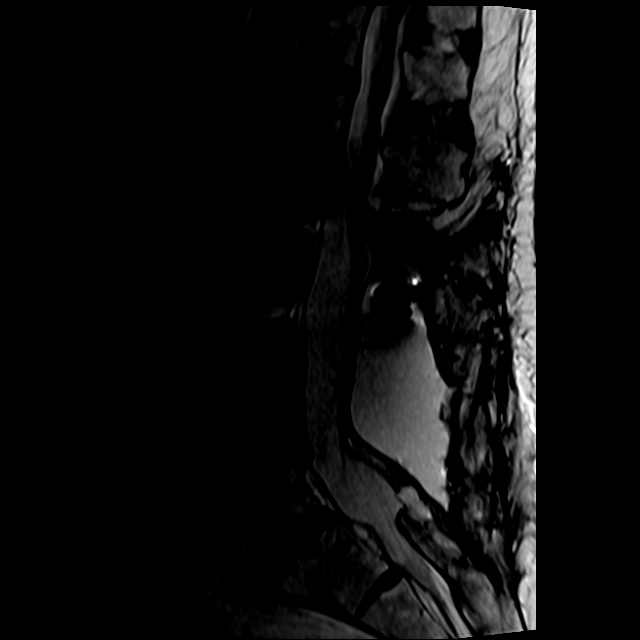
[im 15/15]
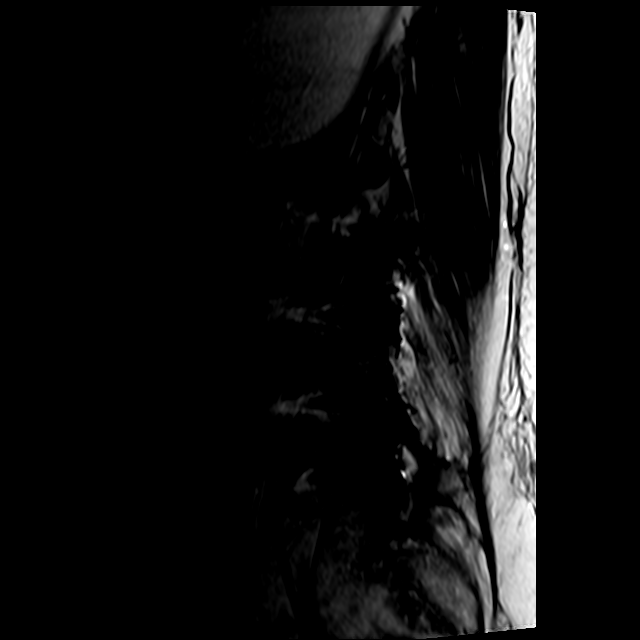

[Series 3: ir_sag_hbw · sagittal · 4.0mm · 0.47mm/px · 3 of 15 slices shown]
[im 1/15]
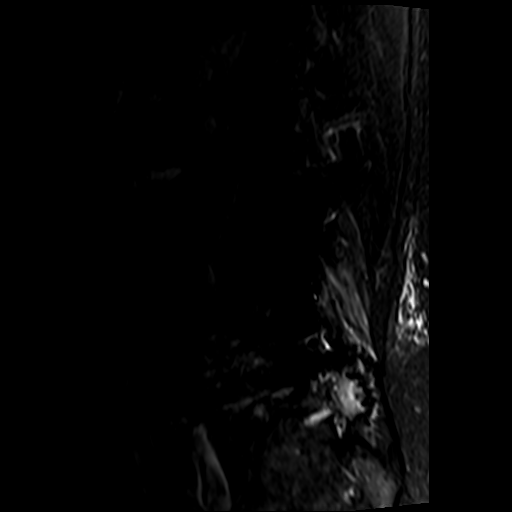
[im 8/15]
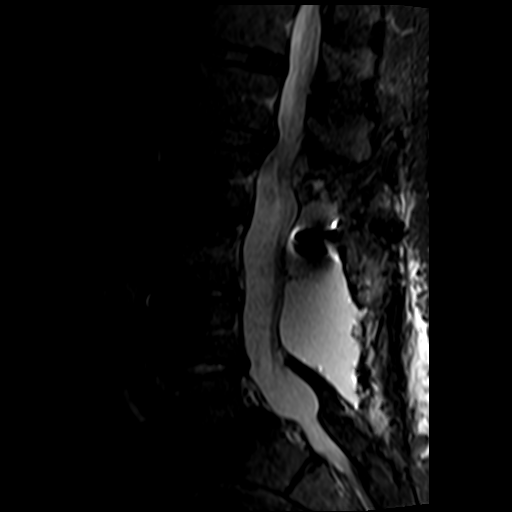
[im 15/15]
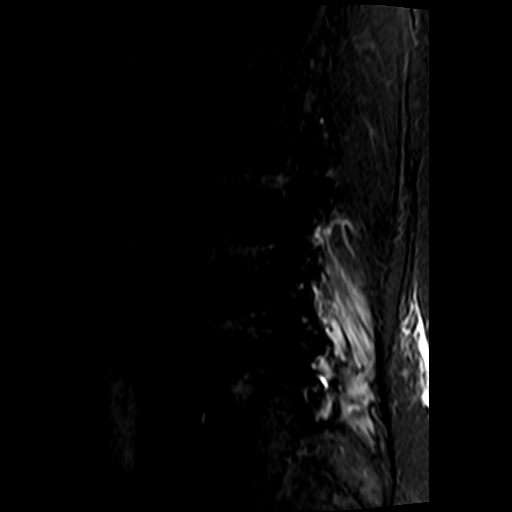

[Series 4: t1_sag_hbw · sagittal · 4.0mm · 0.94mm/px · 3 of 15 slices shown]
[im 1/15]
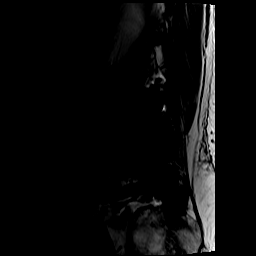
[im 8/15]
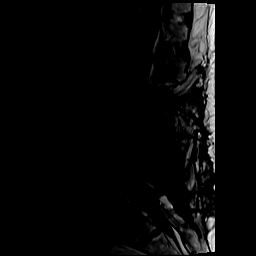
[im 15/15]
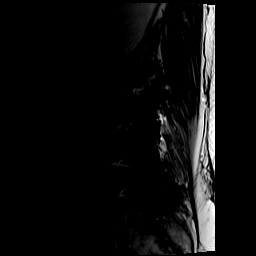

[Series 5: t1_axial_obl_hbw · axial · 4.0mm · 0.86mm/px · z∈[-132,+85]mm · 4 of 25 slices shown]
[im 1/25]
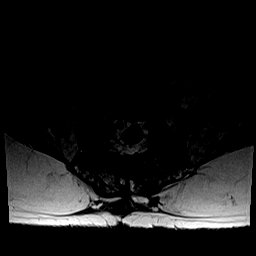
[im 9/25]
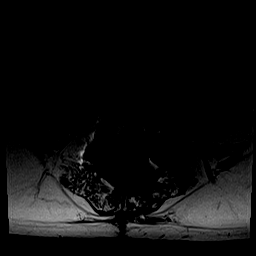
[im 17/25]
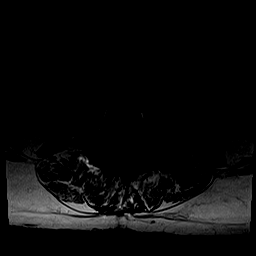
[im 25/25]
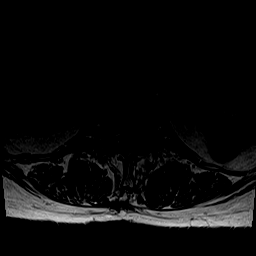

[Series 6: t2_axial_hbw · axial · 4.0mm · 0.86mm/px · z∈[-134,+81]mm · 7 of 38 slices shown]
[im 1/38]
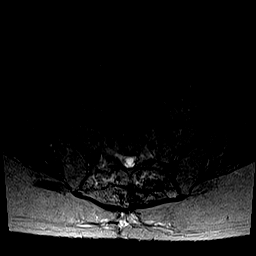
[im 7/38]
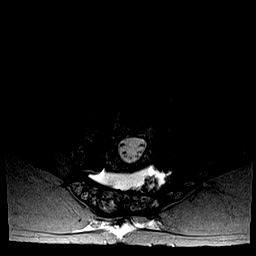
[im 13/38]
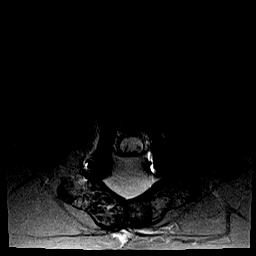
[im 19/38]
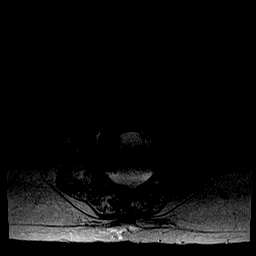
[im 25/38]
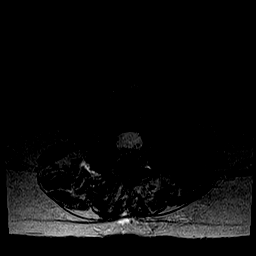
[im 31/38]
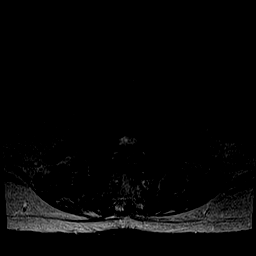
[im 38/38]
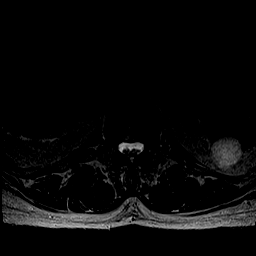

[Series 7: t1_axial_hbw · axial · 4.0mm · 0.82mm/px · z∈[-111,+63]mm · 6 of 36 slices shown]
[im 1/36]
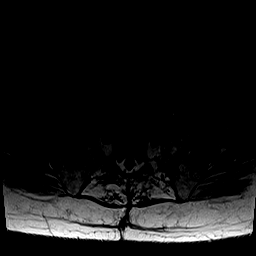
[im 8/36]
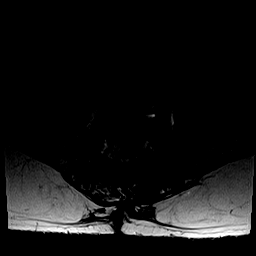
[im 15/36]
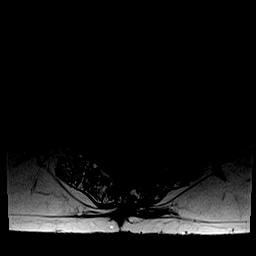
[im 22/36]
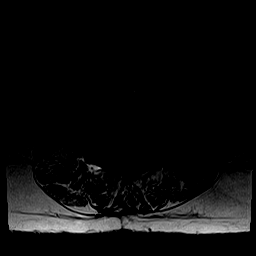
[im 29/36]
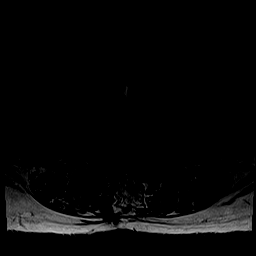
[im 36/36]
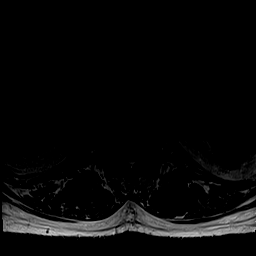

[Series 8: t1_axial_+c_hbw · axial · 4.0mm · 0.82mm/px · z∈[-111,+63]mm · 6 of 36 slices shown]
[im 1/36]
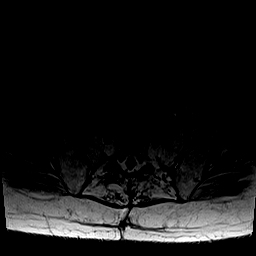
[im 8/36]
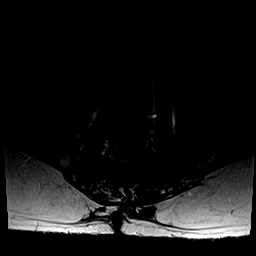
[im 15/36]
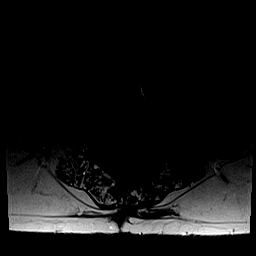
[im 22/36]
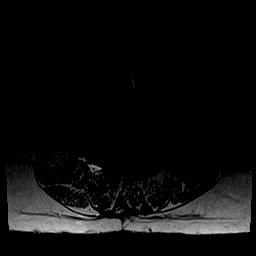
[im 29/36]
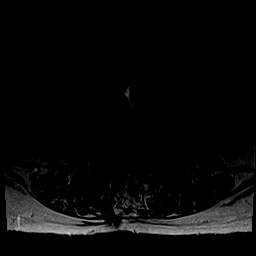
[im 36/36]
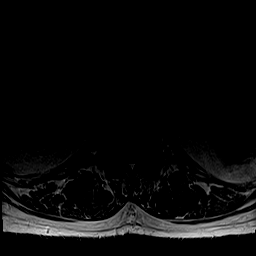

[Series 9: ax_sub · axial · 4.0mm · 0.82mm/px · z∈[-111,+63]mm · 6 of 35 slices shown]
[im 1/35]
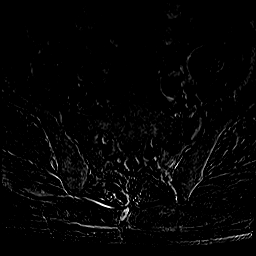
[im 7/35]
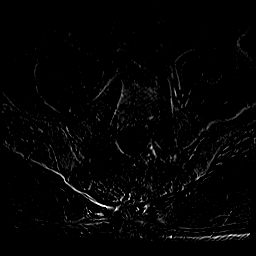
[im 14/35]
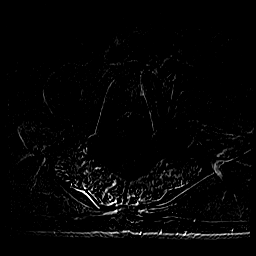
[im 21/35]
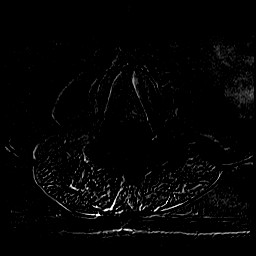
[im 28/35]
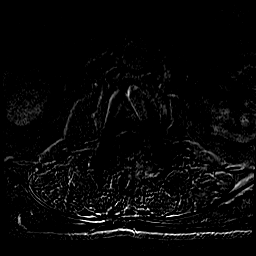
[im 35/35]
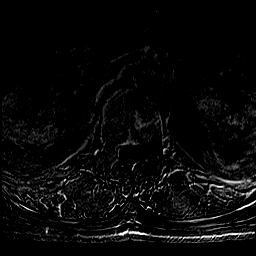

[43 of 48 positions shown; findings below may reference images not displayed]

FINDINGS: COUNT/LABELING: Please note that spinal labeling was performed assuming there are 5 non-rib-bearing lumbar-type vertebrae.
ALIGNMENT:  Grade 1 anterolisthesis of L5 over S1, similar to prior.
VERTEBRAE: Previous posterior decompression instrumented fusion at L2-L5.
INTERVERTEBRAL DISCS: Multilevel disc desiccation with moderate height loss at L5-S1.
PARASPINAL SOFT TISSUES:  Postoperative signal changes in the posterior soft tissues of the lumbar spine with a fluid collection measuring 38 x 48 x 98 mm, grossly similar to prior.
DISTAL THORACIC SPINAL CORD AND CONUS MEDULLARIS:  The distal thoracic spinal cord is normal in signal with the conus medullaris terminating at approximately L1, which is normal.
CAUDA EQUINA NERVE ROOTS: Unremarkable.
FINDINGS BY LEVEL:
T12-L1: No high-grade canal stenosis or foraminal narrowing.
L1-L2:  Disc bulge, facet arthropathy, ligamentum flavum hypertrophy, and endplate spurring. Mild to moderate bilateral lateral recess stenoses with possible encroachment on the descending bilateral
L2 nerve roots, similar to prior. Severe right and moderate left foraminal narrowing, worsened from prior.
L2-L3:  No high-grade canal stenosis or foraminal narrowing.
L3-L4:  No high-grade canal stenosis or foraminal narrowing.
L4-L5:  No high-grade canal stenosis or foraminal narrowing.
L5-S1:  No high-grade canal stenosis or foraminal narrowing.
IMPRESSION: Previous L2-L5 fusion with adjacent level degeneration at L1-L2 causing mild to moderate bilateral lateral recess stenoses with possible encroachment on the descending bilateral L2 nerve roots,
somewhat similar to prior, with severe right and moderate left L1-2 foraminal narrowing, worsened from prior.

## 2021-08-25 ENCOUNTER — Encounter: Admit: 2021-08-25 | Discharge: 2021-08-25 | Payer: No Typology Code available for payment source

## 2021-08-25 NOTE — Telephone Encounter
Received call from Amy RN at [facility] about patient's authorization/RFS. Provided Amy with correct ICD 10 code for cerebral aneurysm. Amy states to proceed with visit tomorrow as RFS sent over today, 08/25/21, will provide authorization for service.

## 2021-08-26 ENCOUNTER — Encounter: Admit: 2021-08-26 | Discharge: 2021-08-26 | Payer: No Typology Code available for payment source

## 2021-08-26 ENCOUNTER — Ambulatory Visit: Admit: 2021-08-26 | Discharge: 2021-08-27 | Payer: No Typology Code available for payment source

## 2021-08-26 DIAGNOSIS — K2270 Barrett's esophagus without dysplasia: Secondary | ICD-10-CM

## 2021-08-26 DIAGNOSIS — M5030 Other cervical disc degeneration, unspecified cervical region: Secondary | ICD-10-CM

## 2021-08-26 DIAGNOSIS — G44029 Chronic cluster headache, not intractable: Secondary | ICD-10-CM

## 2021-08-26 DIAGNOSIS — M542 Cervicalgia: Secondary | ICD-10-CM

## 2021-08-26 DIAGNOSIS — R7301 Impaired fasting glucose: Secondary | ICD-10-CM

## 2021-08-26 DIAGNOSIS — F17200 Nicotine dependence, unspecified, uncomplicated: Secondary | ICD-10-CM

## 2021-08-26 DIAGNOSIS — I671 Cerebral aneurysm, nonruptured: Secondary | ICD-10-CM

## 2021-08-26 DIAGNOSIS — N200 Calculus of kidney: Secondary | ICD-10-CM

## 2021-08-26 DIAGNOSIS — M5137 Other intervertebral disc degeneration, lumbosacral region: Secondary | ICD-10-CM

## 2021-08-26 DIAGNOSIS — E039 Hypothyroidism, unspecified: Secondary | ICD-10-CM

## 2021-08-26 DIAGNOSIS — M1990 Unspecified osteoarthritis, unspecified site: Secondary | ICD-10-CM

## 2021-08-26 DIAGNOSIS — E782 Mixed hyperlipidemia: Secondary | ICD-10-CM

## 2021-08-26 DIAGNOSIS — F4312 Post-traumatic stress disorder, chronic: Secondary | ICD-10-CM

## 2021-08-26 DIAGNOSIS — I82409 Acute embolism and thrombosis of unspecified deep veins of unspecified lower extremity: Secondary | ICD-10-CM

## 2021-08-26 DIAGNOSIS — J449 Chronic obstructive pulmonary disease, unspecified: Secondary | ICD-10-CM

## 2021-08-26 DIAGNOSIS — Z9981 Dependence on supplemental oxygen: Secondary | ICD-10-CM

## 2021-08-26 DIAGNOSIS — G4733 Obstructive sleep apnea (adult) (pediatric): Secondary | ICD-10-CM

## 2021-08-26 DIAGNOSIS — I10 Essential (primary) hypertension: Secondary | ICD-10-CM

## 2021-08-26 DIAGNOSIS — N400 Enlarged prostate without lower urinary tract symptoms: Secondary | ICD-10-CM

## 2021-08-26 DIAGNOSIS — K219 Gastro-esophageal reflux disease without esophagitis: Secondary | ICD-10-CM

## 2021-08-26 DIAGNOSIS — F1291 Marijuana use in remission: Secondary | ICD-10-CM

## 2021-08-26 DIAGNOSIS — E559 Vitamin D deficiency, unspecified: Secondary | ICD-10-CM

## 2021-08-26 DIAGNOSIS — F32A Depression: Secondary | ICD-10-CM

## 2021-08-26 DIAGNOSIS — T8859XA Other complications of anesthesia, initial encounter: Secondary | ICD-10-CM

## 2021-08-26 DIAGNOSIS — Z8719 Personal history of other diseases of the digestive system: Secondary | ICD-10-CM

## 2021-08-26 NOTE — Patient Instructions
It was a pleasure seeing you today in the Neurosurgery Clinic via Telehealth. Dr. Vonita Moss would like to see you back in clinic in 1 year with a MRA Head prior.  We will fax this order to [place] or the [place] and our scheduling team should contact you to schedule your appointment closer to that time.     If you have any questions or concerns, please don't hesitate to call me at [phone] or send a message via MyChart.  Thank you and take care!     Mertie Clause RN, BSN  Clinical Nurse Coordinator  Dr. Consuela [facility]imes [facility] Phillips Odor, APRN-NP  [facility]  Phone: [phone]  Fax: [phone]

## 2021-09-28 IMAGING — CR CHEST 2 VWS PA LAT
2 series · 2 of 2 positions shown · non-contrast
Comparison: Previous chest August 11, 2021

Images Obtained from [facility]
INDICATION: Encounter for other preprocedural examination.
TECHNIQUE: Chest 2 views

[w chest pa]
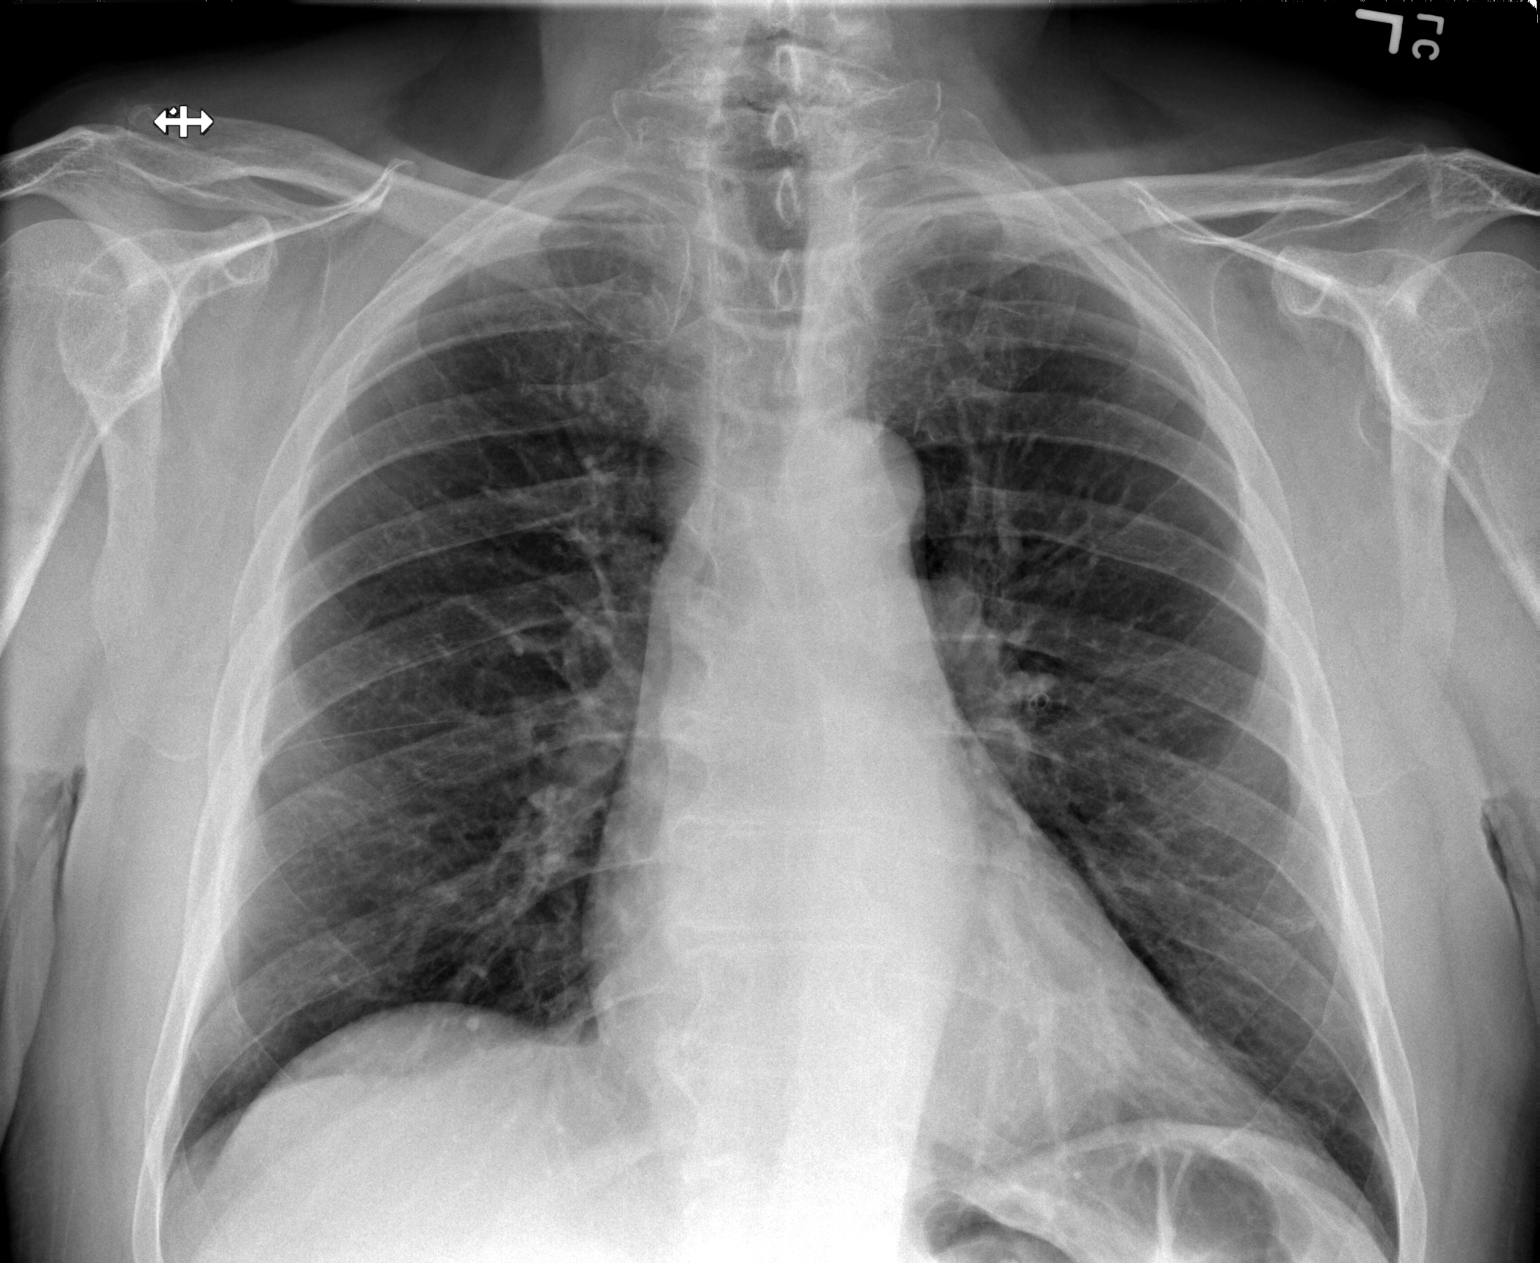

[w chest lat]
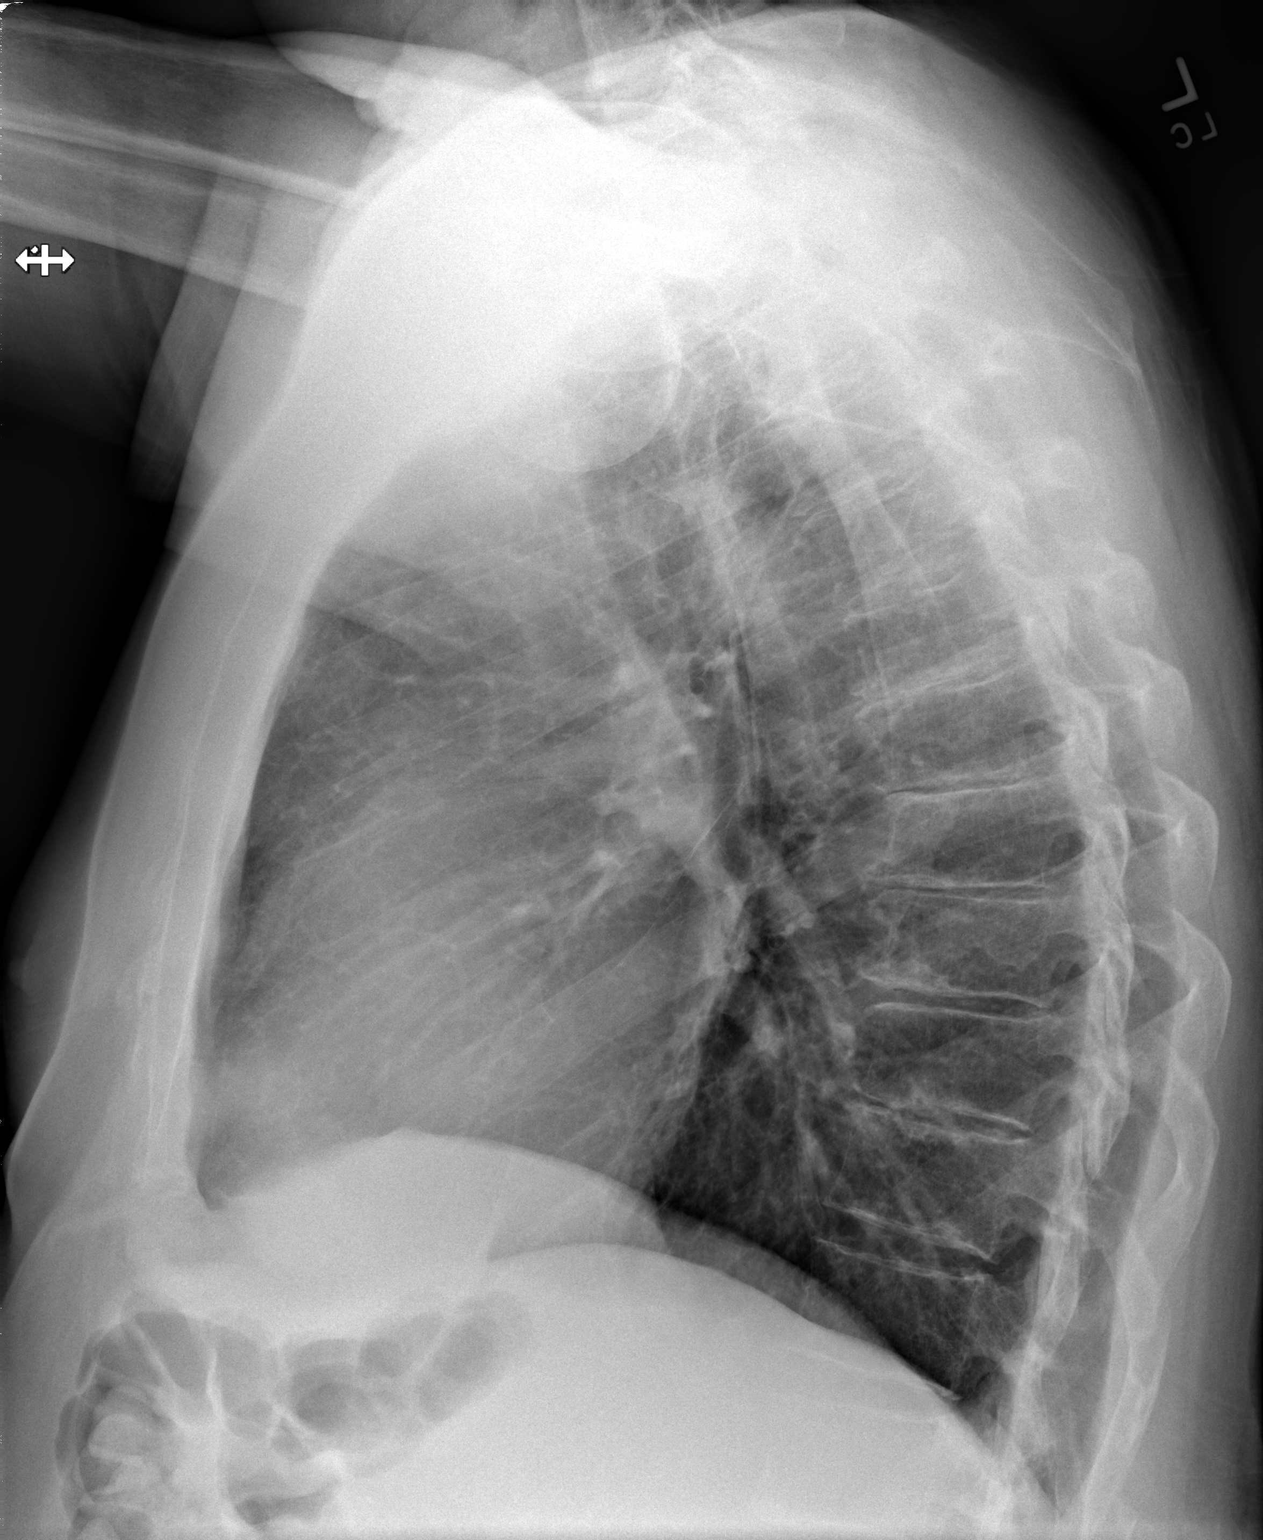

[2 of 2 positions shown; findings below may reference images not displayed]

FINDINGS: The heart size is normal. The lungs are clear. No pleural effusion, pulmonary congestion or mediastinal mass is seen. Aortic arch is calcified in keeping with atherosclerotic vascular disease. No
bone lesion is seen. Degenerative changes are seen in the spine and shoulders. No significant change is seen when compared to the previous examination.
IMPRESSION: Stable chest. No acute disease.

## 2021-11-03 ENCOUNTER — Encounter: Admit: 2021-11-03 | Discharge: 2021-11-03 | Payer: No Typology Code available for payment source

## 2022-03-23 IMAGING — CR L-SPINE 4 VWS MIN
4 series · 4 of 4 positions shown · non-contrast
Comparison: 05/23/2021
TECHNIQUE AND FINDINGS:
4 views of the lumbar spine were obtained including flexion and extension lateral views.

Images Obtained from [facility]
REASON FOR EXAM: Lumbosacral spinal stenosis

[w lumbar spine ap]
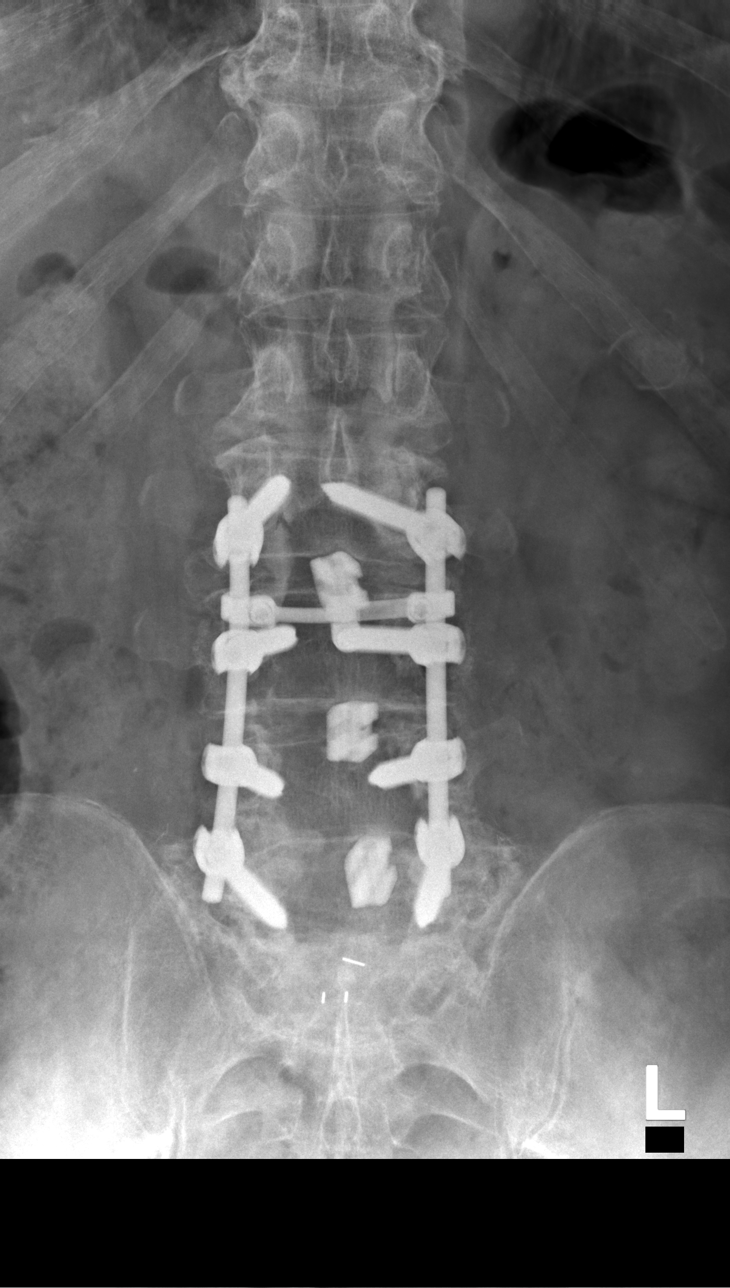

[w lumbar spine lat]
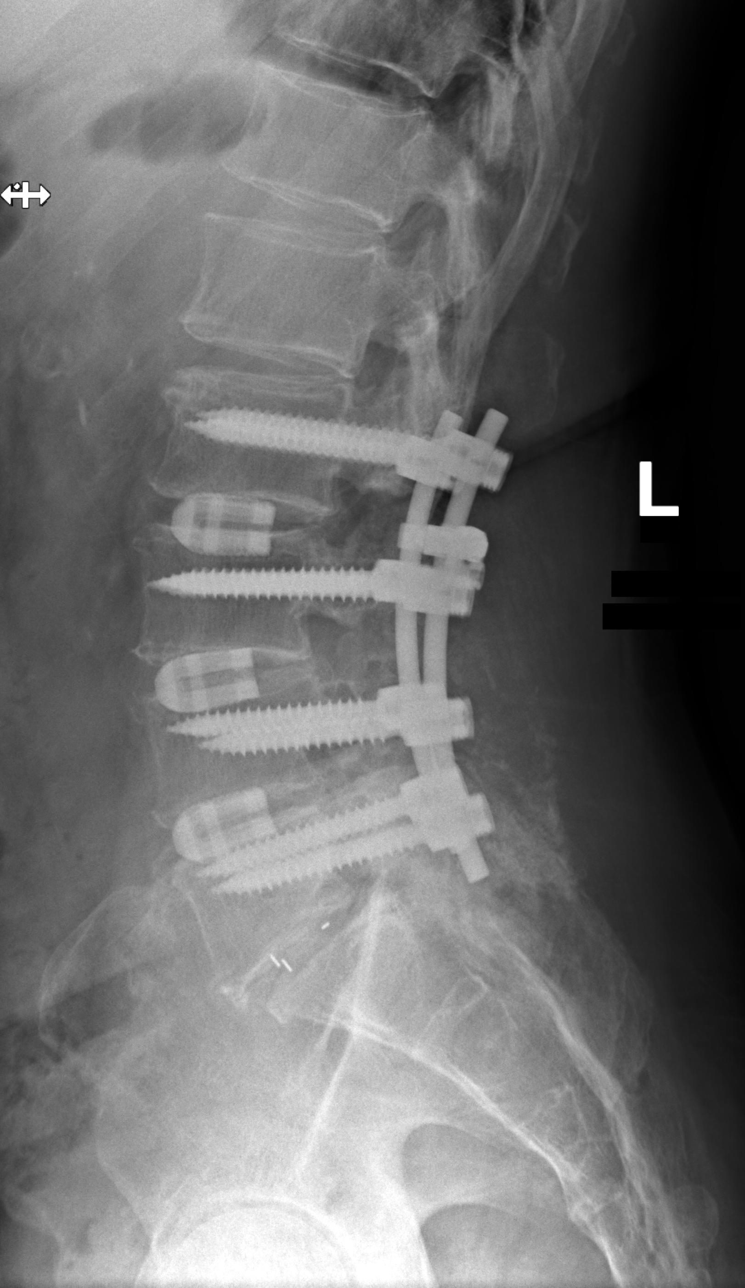

[w lumbar spine extension]
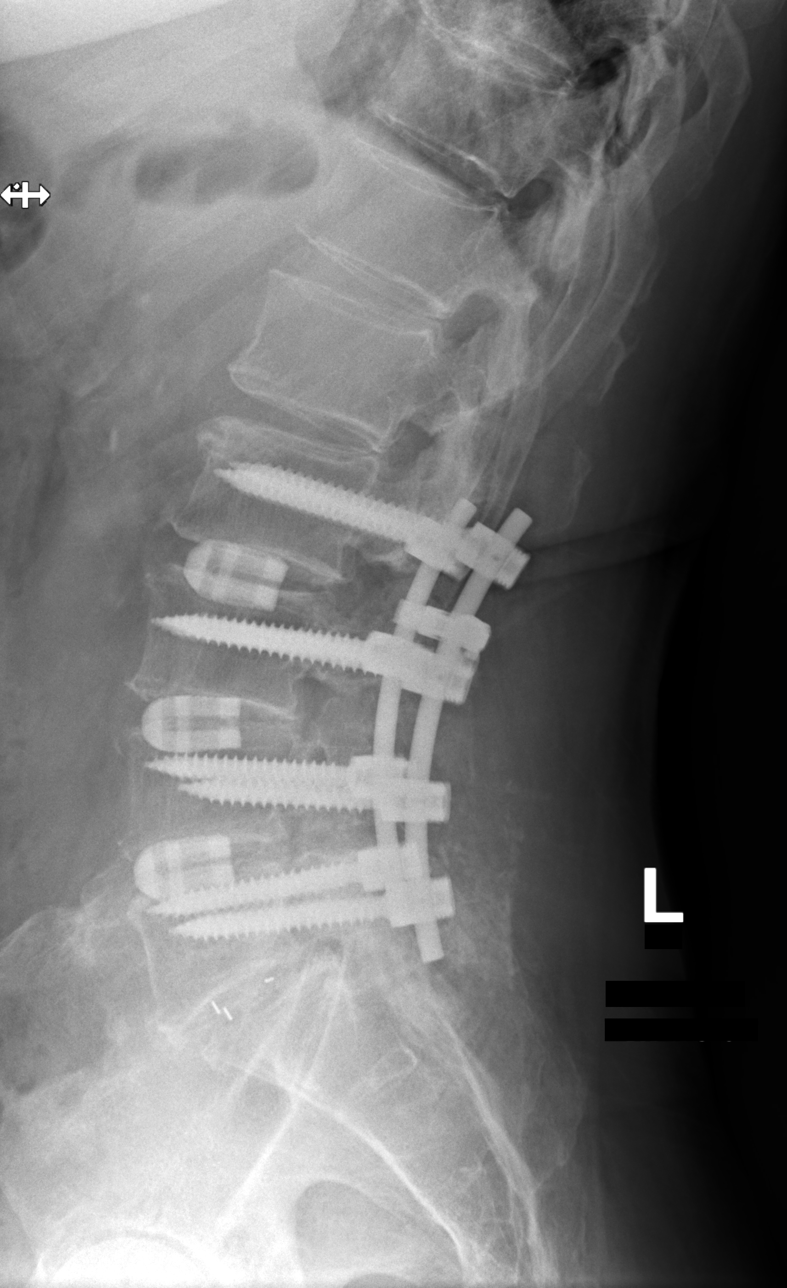

[w lumbar spine flexion]
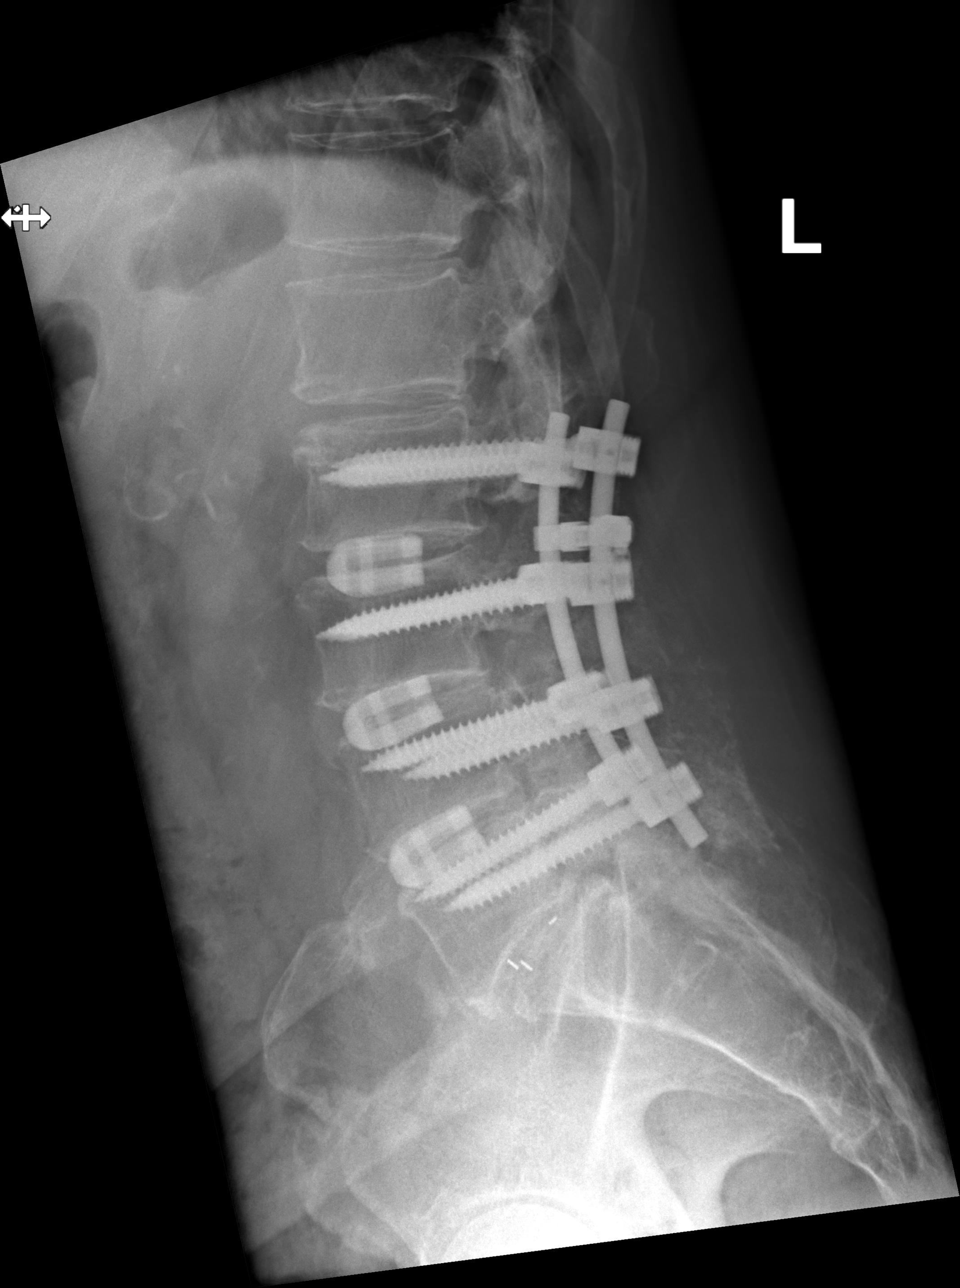

[4 of 4 positions shown; findings below may reference images not displayed]

There are five lumbar type vertebral bodies. Patient is status post posterior fusion L2-L5 with transpedicular
screws, vertical and horizontal fixation rods. Disc spacer material is been placed at each level. Postlaminectomy changes are seen. No lucency is seen around the hardware The vertebral bodies have
normal height and alignment. There is mild disc space narrowing seen at L1-L2. Facets are in good alignment. Hypertrophic changes are seen around the facets at L4-5 and L5-S1. No pars defects are
seen. Soft tissues are unremarkable.
With flexion and extension, there is no evidence of instability.
IMPRESSION: Expected postsurgical changes as described above.

## 2022-03-23 IMAGING — MR MRI LSPINE WO/W CONTRAST
9 of 12 series · 35 of 48 positions shown · IV contrast (20CC PROHANCE)
Comparison: Lumbar spine x-ray study from same day and lumbar spine MR study 08/24/2021.

Images Obtained from [facility]
HISTORY: Cervicalgia, Spinal stenosis, lumbosacral region
TECHNIQUE: Coronal, sagittal and axial noncontrast and contrast MRI lumbar spine study performed without and with 20 mL of ProHance.

[Series 1: bSSFP · axial · 8.0mm · 1.37mm/px · z∈[-61,+175]mm · 3 of 25 slices shown]
[im 1/25]
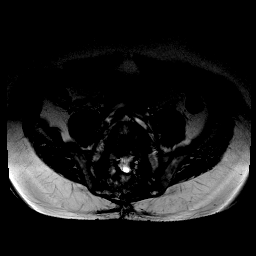
[im 13/25]
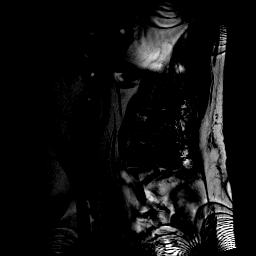
[im 25/25]
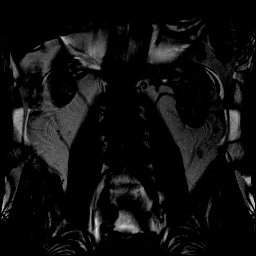

[Series 2: t2_cor_hbw · coronal · 5.0mm · 0.55mm/px · 2 of 17 slices shown]
[im 1/17]
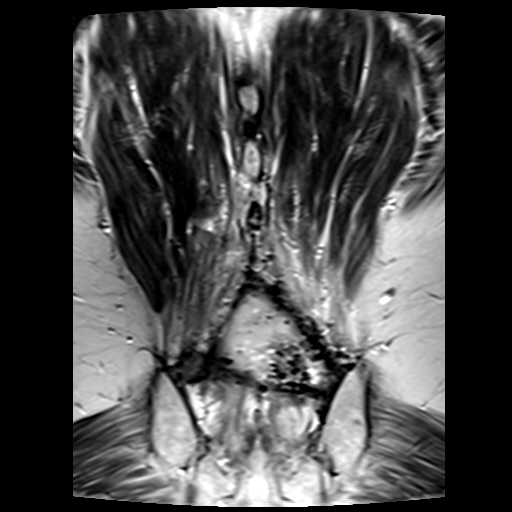
[im 17/17]
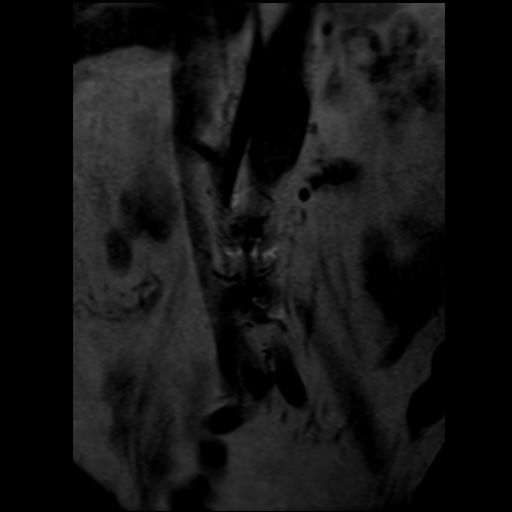

[Series 3: t2_sag_hbw · sagittal · 4.0mm · 0.41mm/px · 3 of 18 slices shown]
[im 1/18]
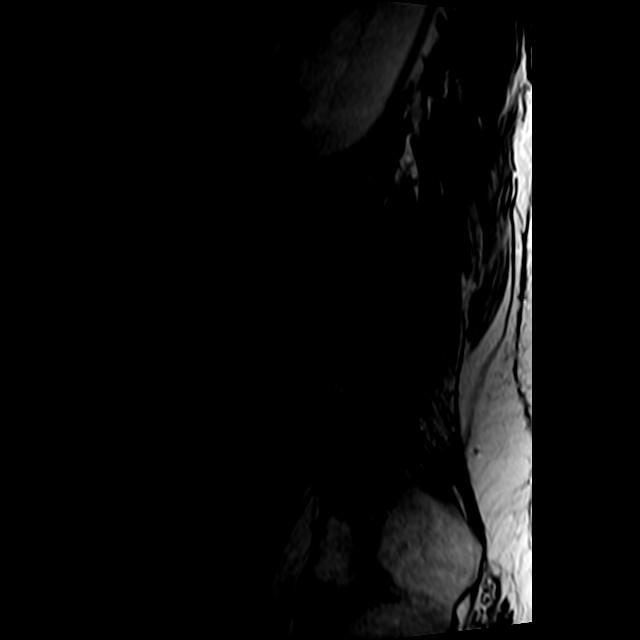
[im 9/18]
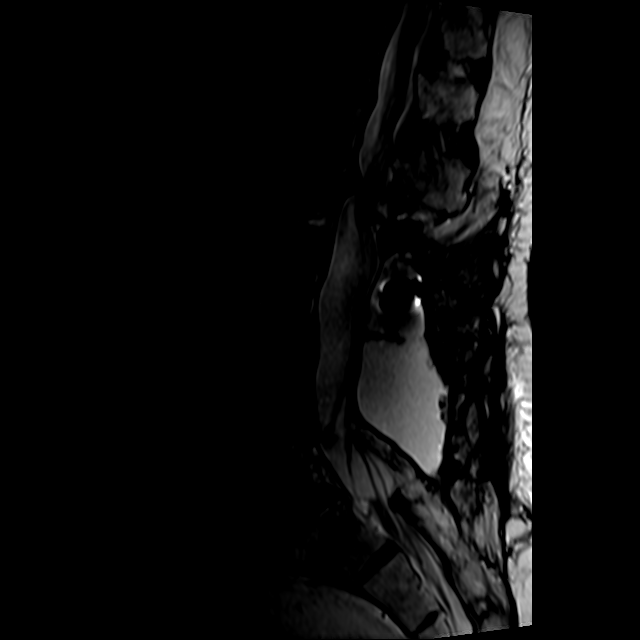
[im 18/18]
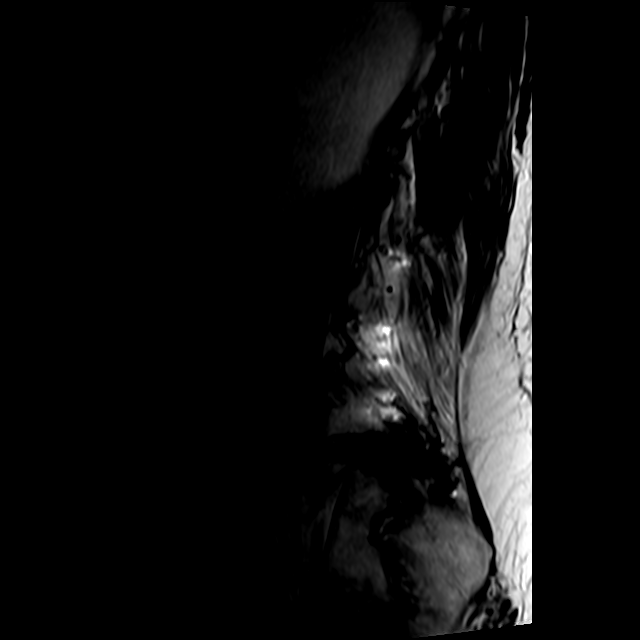

[Series 4: ir_sag_hbw · sagittal · 4.0mm · 0.51mm/px · 3 of 18 slices shown]
[im 1/18]
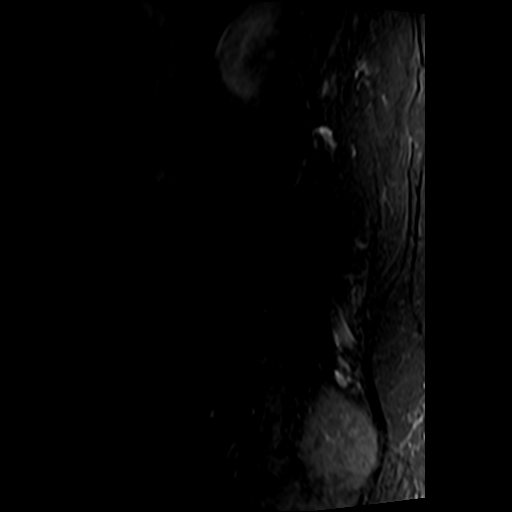
[im 9/18]
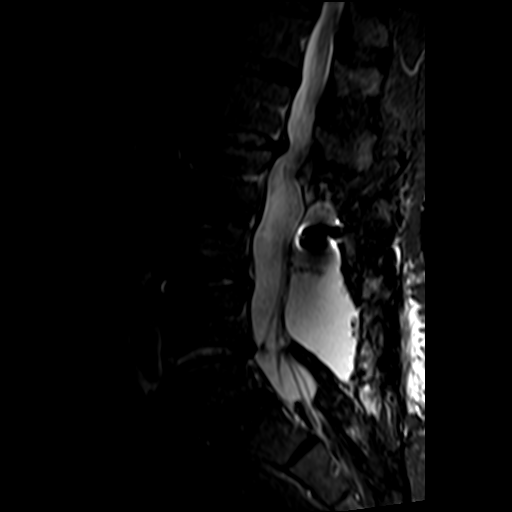
[im 18/18]
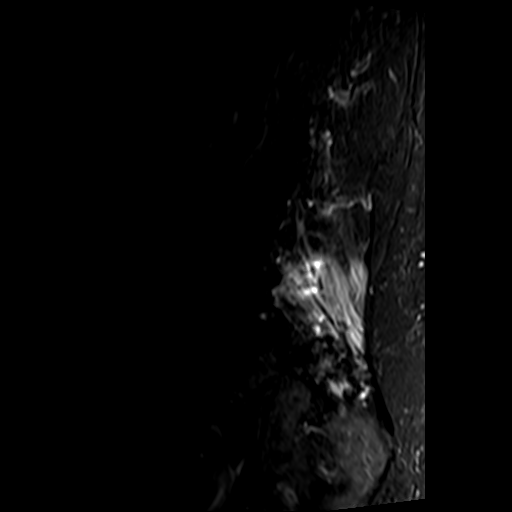

[Series 5: t1_sag_hbw · sagittal · 4.0mm · 1.02mm/px · 3 of 18 slices shown]
[im 1/18]
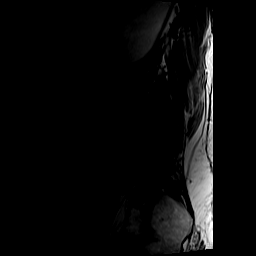
[im 9/18]
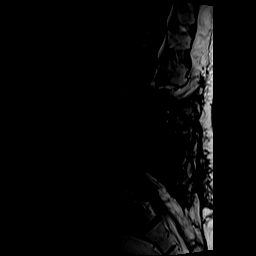
[im 18/18]
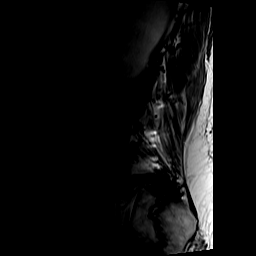

[Series 6: t1_axial_obl_hbw · oblique · 4.0mm · 0.86mm/px · 4 of 25 slices shown]
[im 1/25]
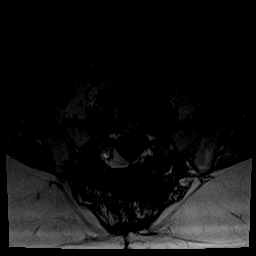
[im 9/25]
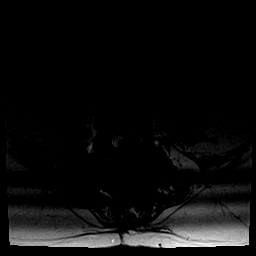
[im 17/25]
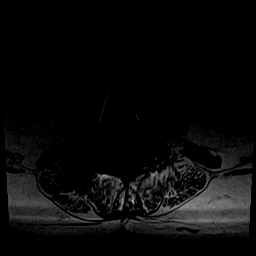
[im 25/25]
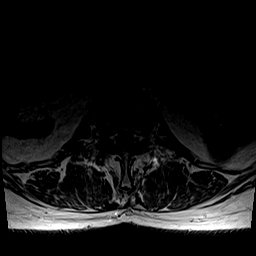

[Series 7: t2_axial_hbw · axial · 4.0mm · 0.86mm/px · z∈[-80,+90]mm · 6 of 35 slices shown]
[im 1/35]
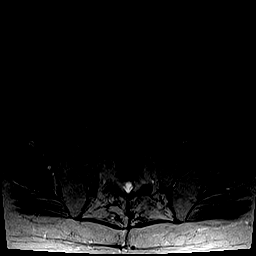
[im 7/35]
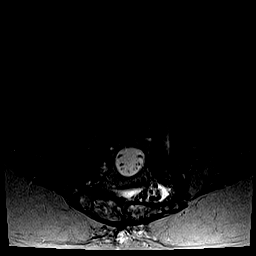
[im 14/35]
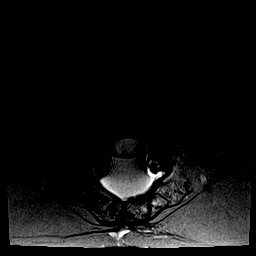
[im 21/35]
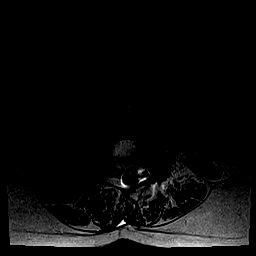
[im 28/35]
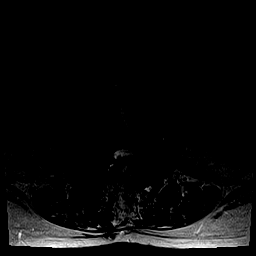
[im 35/35]
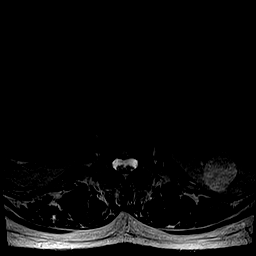

[Series 8: t1_axial_hbw · axial · 4.0mm · 0.82mm/px · z∈[-80,+90]mm · 6 of 35 slices shown]
[im 1/35]
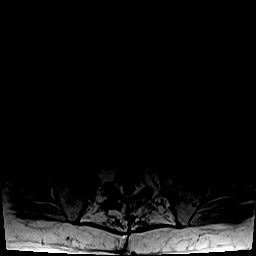
[im 7/35]
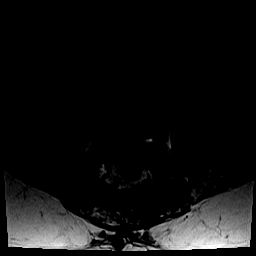
[im 14/35]
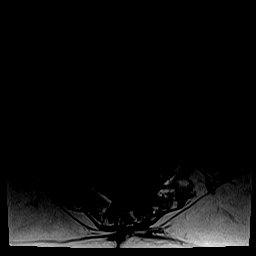
[im 21/35]
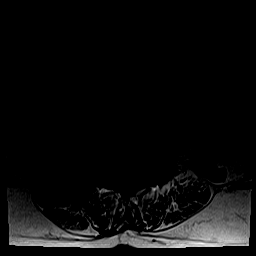
[im 28/35]
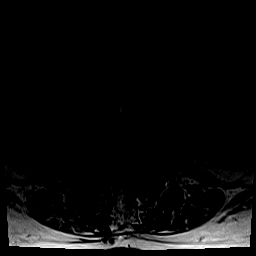
[im 35/35]
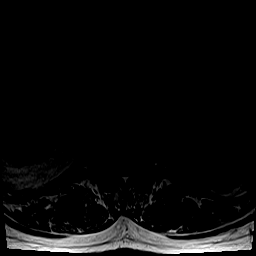

[Series 9: t1_axial_+c_hbw · axial · 4.0mm · 0.82mm/px · z∈[-80,+55]mm · 5 of 35 slices shown]
[im 1/35]
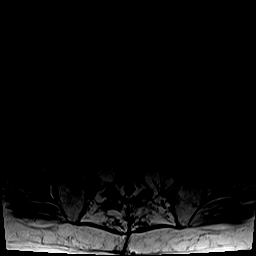
[im 7/35]
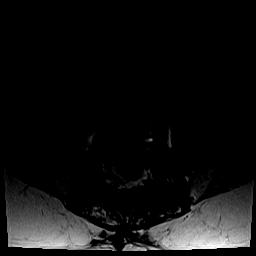
[im 14/35]
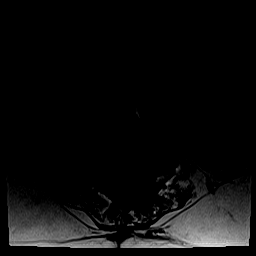
[im 21/35]
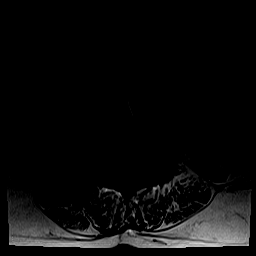
[im 28/35]
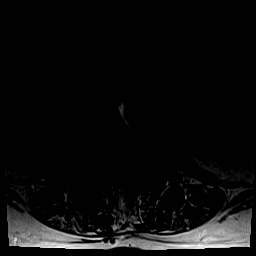

[35 of 48 positions shown; findings below may reference images not displayed]

FINDINGS: 5 nonrib-bearing lumbar vertebrae seen. Mild grade 1 anterospondylolisthesis of L3 relative to L2 and L4 by 2 to 3 mm seen. Grade 1 anterior spondylolisthesis of L5 relative to L4 and S1
by 3 to 4 mm seen. Previous L2-L5 laminectomy and fusion with artifacts from bilateral pedicle fixation screws, spinal fusion rods and stabilization bar seen. Mild-to-moderate endplate spurs of
lumbar spine seen. Degenerative endplate changes seen. Endplate Schmorl's nodes seen. No acute fracture is seen.
Interbody placement at the L2-L3 level and L5-S1 level seen. Decreased disc T2 signal of lumbar spine and lower thoracic spine with mild to moderate disc space narrowing seen.
T11-T12, mild disc bulge, mild degenerative facet disease with mild ligamentum flavum hypertrophy seen, producing mild spinal canal stenosis. Neural foramina are likely patent.
T12-L1, mild disc bulge, mild degenerative facet disease with mild ligamentum flavum hypertrophy seen, producing mild spinal canal stenosis. Mild left T12 neural foraminal stenosis seen. Right T12
neural foramina is patent.
L1-L2, broad-based disc bulge, mild to moderate degenerative facet disease with moderate ligamentum flavum hypertrophy seen, producing overall moderate spinal canal stenosis. Moderate bilateral L1
neural foraminal stenosis seen, due to foraminal disc bulge and degenerative facets.
L2-L3, no disc herniation or spinal canal stenosis seen. Neural foramina evaluation is suboptimal with suspected mild bilateral L3 neural foraminal stenosis.
L3-L4, mild endplate spur formation seen. No disc herniation or spinal canal stenosis seen. Likely mild right L3 neural foraminal stenosis due to foraminal disc bulge seen. Left L3 neural foramina is
patent.
L4-L5, mild disc bulge identified. There is no spinal canal stenosis. Likely mild-to-moderate right L4 neural foraminal stenosis due to foraminal disc bulge seen. Left L4 neural foramina is patent.
L5-S1, no disc herniation or spinal canal stenosis seen. Mild-to-moderate bilateral L5 neural foraminal stenosis due to spondylolisthesis, foraminal disc bulge and degenerative facets is seen.
Simple appearing fluid collection at laminectomy defect seen, up to approximately 9.6 cm CC by 3.7 cm AP by 7 cm transverse.
Likely areas of granulation tissue adjacent to the thecal sac at area of laminectomy seen.
Mild degenerative changes of SI joints seen.
Likely simple cysts of kidneys seen.
Scar tissue lumbar spine region posteriorly involving subcutaneous adipose tissue also seen.
IMPRESSION: 1.  Degenerative changes and postsurgical changes of lumbar spine with areas of spondylolisthesis as noted.
2.  Mild to moderate spinal canal stenosis seen, most prominent at L1-L2 level.
3.  Bilateral mild-to-moderate neural foraminal stenosis identified, most prominent involving bilateral L1 neural foramina.
4.  Simple appearing fluid collection at laminectomy defect due to seroma, less likely pseudomeningocele seen.
5.  No other significant interval change.

## 2022-05-26 ENCOUNTER — Encounter: Admit: 2022-05-26 | Discharge: 2022-05-26 | Payer: No Typology Code available for payment source

## 2022-05-26 NOTE — Telephone Encounter
Patient scheduled 09/01/22 to see Dr. Terance Hart. Needs MRA Head prior. Patient would like this completed at [facility]. Current [facility] authorization on file [place] [id number] for [place] valid thru 11/06/2022 (covers clinic visit). RFS submitted for MRA Head to be completed at [place] signed RFS to [facility].

## 2022-07-03 ENCOUNTER — Encounter: Admit: 2022-07-03 | Discharge: 2022-07-03 | Payer: No Typology Code available for payment source

## 2022-07-03 NOTE — Telephone Encounter
Faxed request for disc to be mailed of MRA done 06/30/22. Provided mailing address and fax return number for imaging report.

## 2022-07-08 ENCOUNTER — Encounter: Admit: 2022-07-08 | Discharge: 2022-07-08 | Payer: No Typology Code available for payment source

## 2022-07-14 IMAGING — CR SACROILIAC JNTS 3 VWS MIN
3 series · 3 of 3 positions shown · non-contrast
Comparison: None

Images Obtained from [facility]
SI joints 3 views
INDICATIONS:  Spinal stenosis, lumbosacral region

[t sacroiliac joints (1 of 3)]
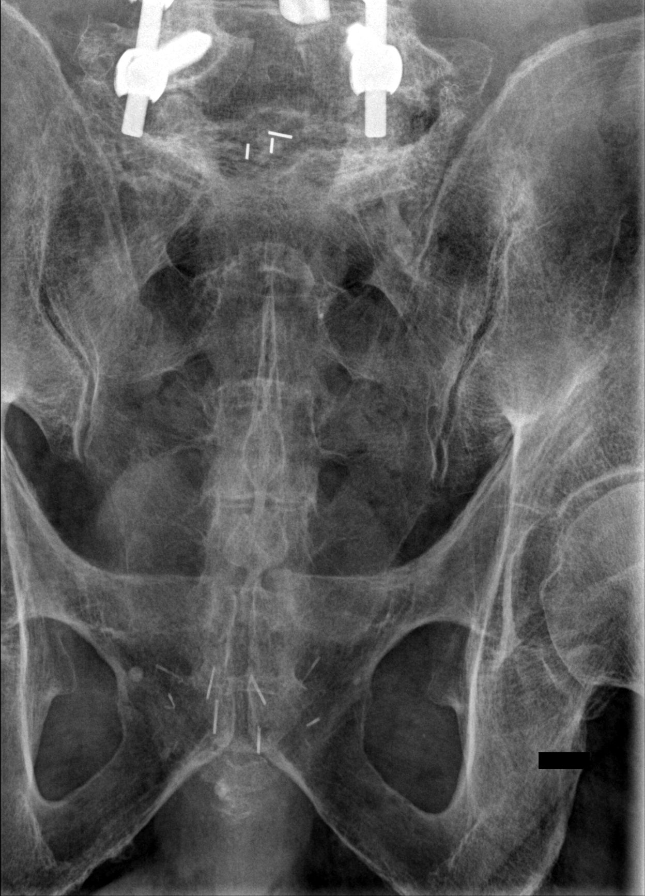

[t sacroiliac joints (2 of 3)]
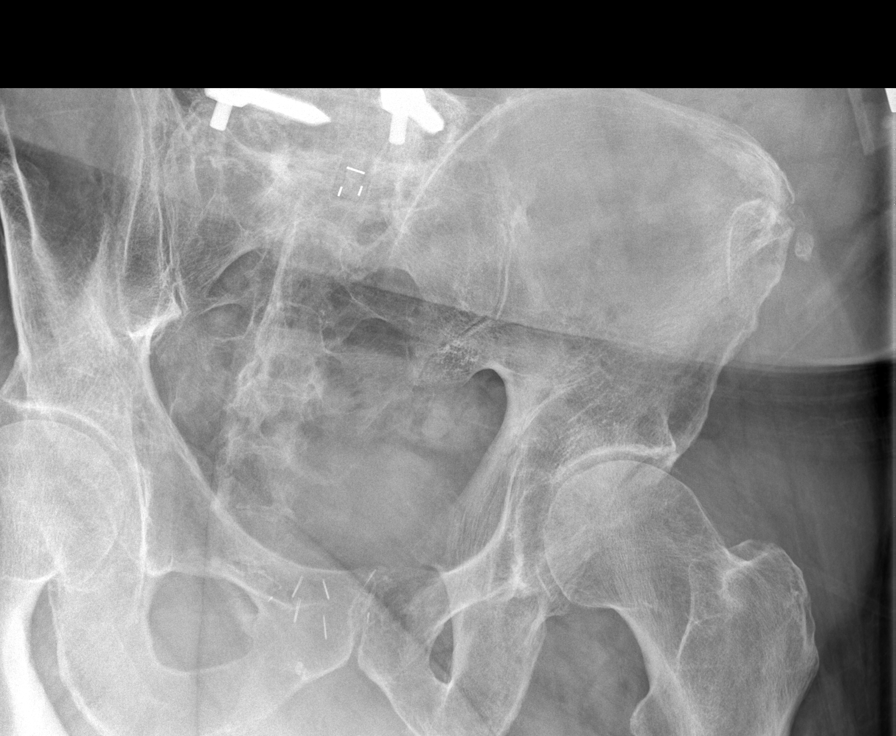

[t sacroiliac joints (3 of 3)]
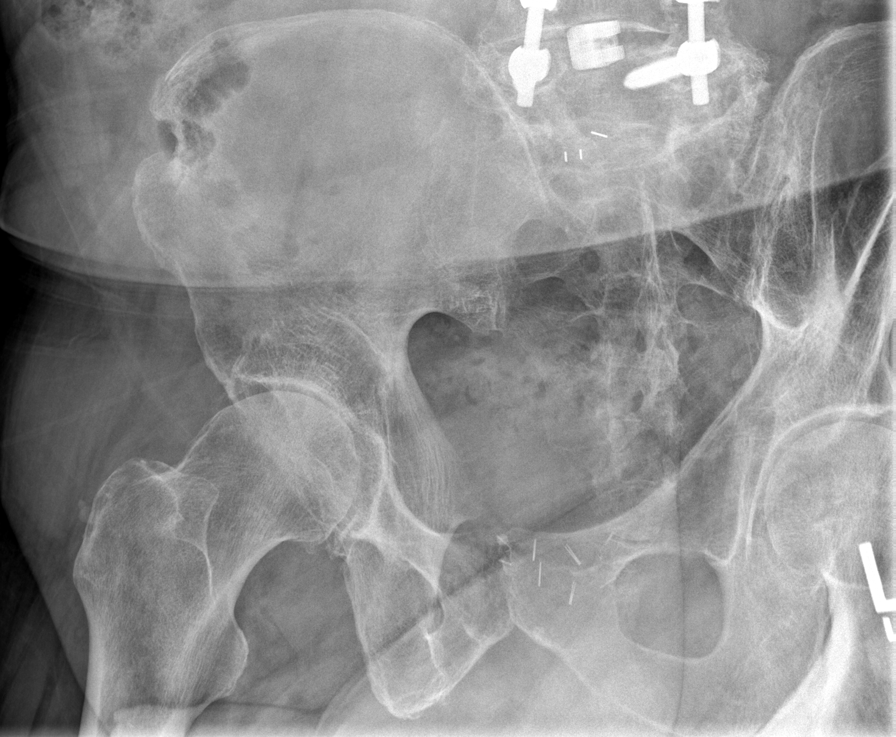

[3 of 3 positions shown; findings below may reference images not displayed]

FINDINGS: Mild bilateral SIJ DJD. No erosion. Demineralized bones.
IMPRESSION: Mild SIJ DJD.

## 2022-08-10 IMAGING — CR CHEST 2 VWS PA LAT
2 series · 2 of 2 positions shown · non-contrast
Comparison: September 28, 2021
TECHNIQUE AND FINDINGS:
Two views of the chest show no focal infiltrate or consolidation.

Images Obtained from [facility]
REASON FOR EXAM: Preop exam

[w chest pa]
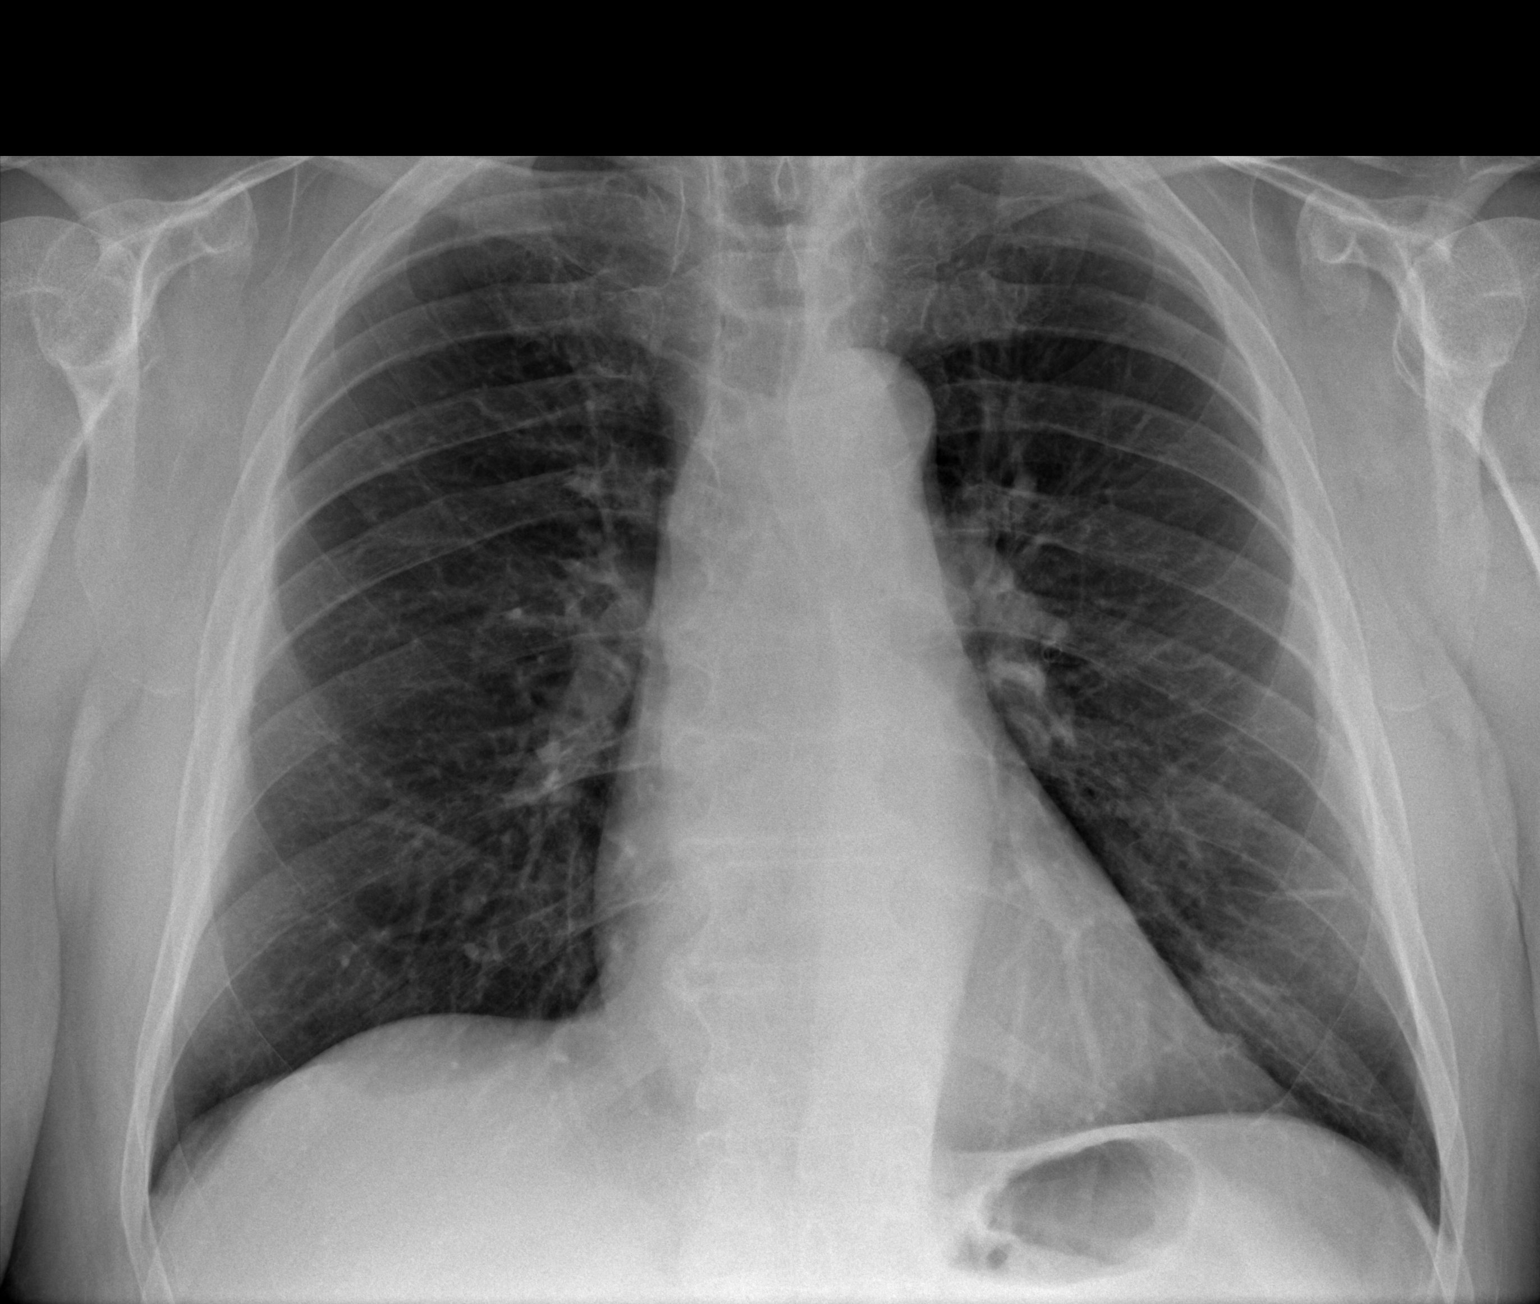

[w chest lat]
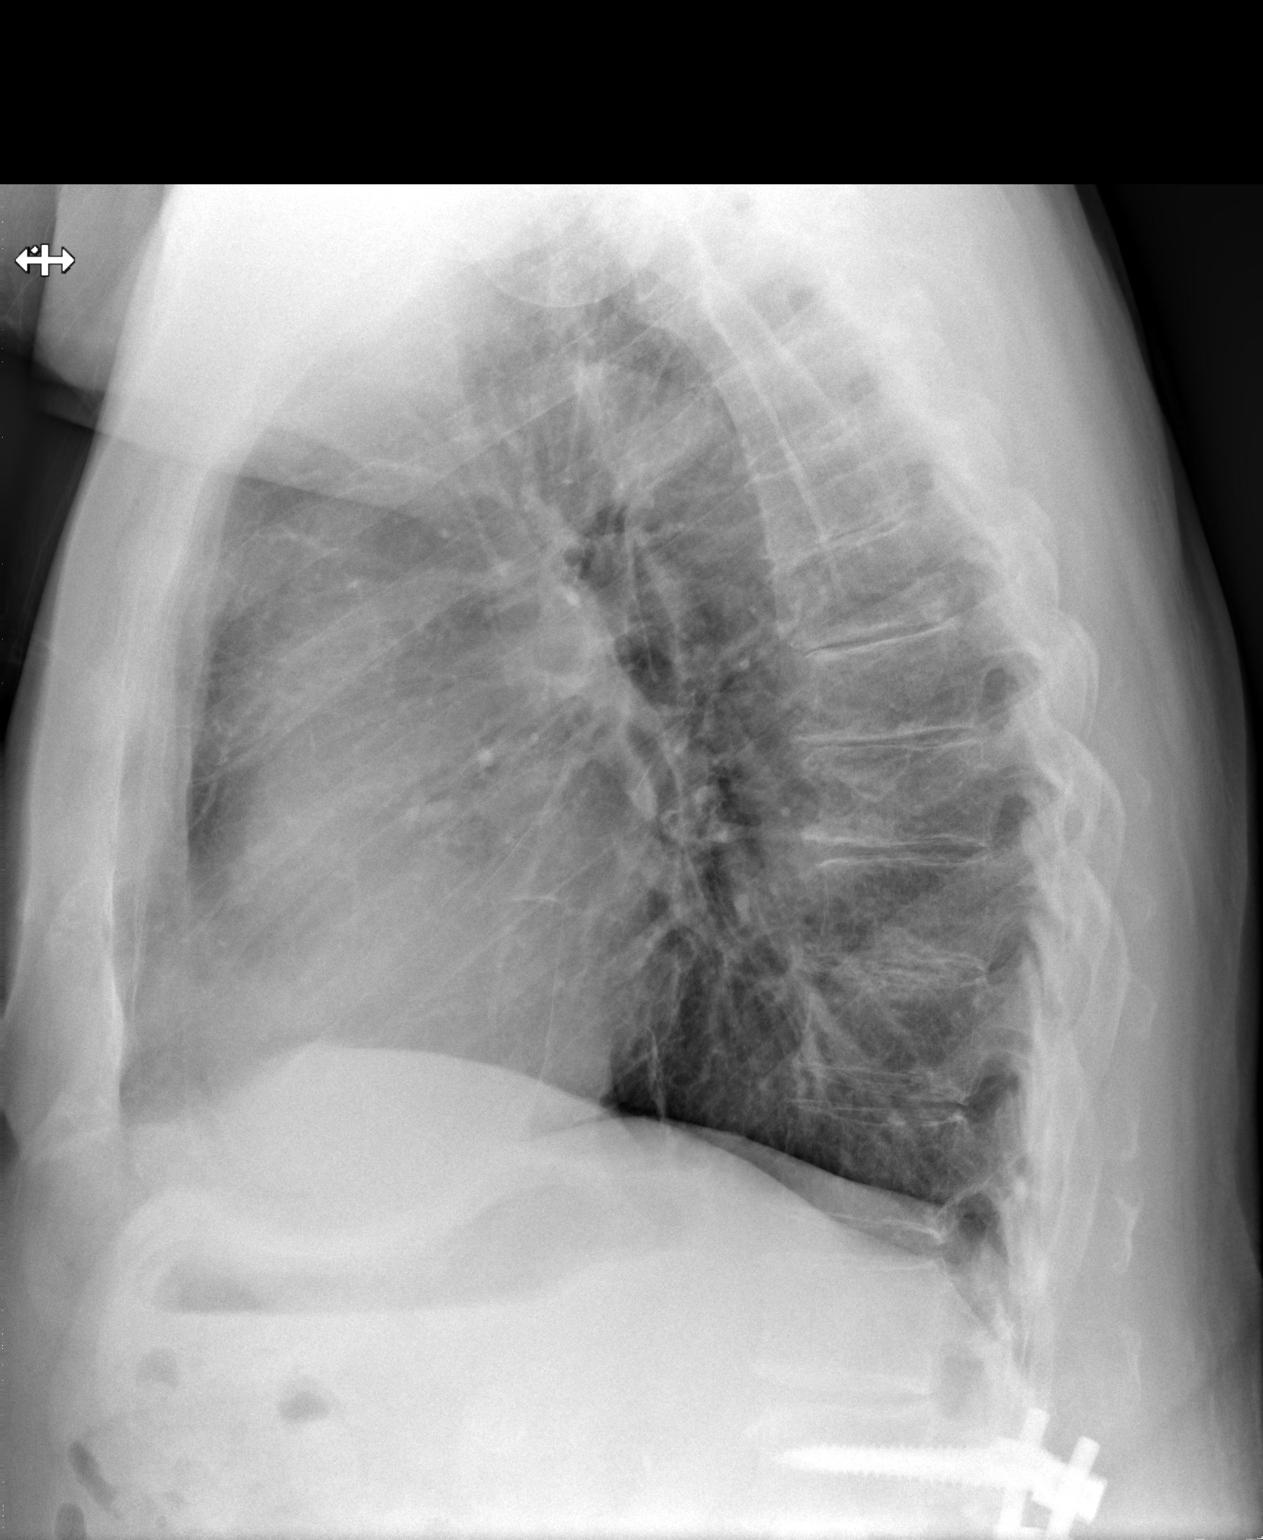

[2 of 2 positions shown; findings below may reference images not displayed]

Pleural spaces are clear. The cardiac and mediastinal silhouettes are normal. The bones are unremarkable.
IMPRESSION: No acute disease.

## 2022-08-15 ENCOUNTER — Encounter: Admit: 2022-08-15 | Discharge: 2022-08-15 | Payer: No Typology Code available for payment source

## 2022-08-29 ENCOUNTER — Encounter: Admit: 2022-08-29 | Discharge: 2022-08-29 | Payer: No Typology Code available for payment source

## 2022-09-29 ENCOUNTER — Encounter: Admit: 2022-09-29 | Discharge: 2022-09-29 | Payer: No Typology Code available for payment source

## 2022-09-29 ENCOUNTER — Ambulatory Visit: Admit: 2022-09-29 | Discharge: 2022-09-30 | Payer: No Typology Code available for payment source

## 2022-09-29 DIAGNOSIS — I671 Cerebral aneurysm, nonruptured: Secondary | ICD-10-CM

## 2022-09-29 NOTE — Patient Instructions
It was a pleasure seeing you today in the Neurosurgery Clinic. Dr. Vonita Moss would like to see you back in clinic in _____ with a _______ prior. Our scheduling team should contact you to schedule your appointments.     If you have any questions or concerns, please don't hesitate to call me at [phone] or send a message via MyChart.  Thank you and take care!     Tracy Citizen, RN, BSN  Clinical Nurse Coordinator for Dr. Consuela Mimes   [facility]  Phone: [phone] - Fax: [phone]

## 2023-08-14 ENCOUNTER — Encounter: Admit: 2023-08-14 | Discharge: 2023-08-14 | Payer: PRIVATE HEALTH INSURANCE

## 2023-09-24 ENCOUNTER — Encounter: Admit: 2023-09-24 | Discharge: 2023-09-24 | Payer: PRIVATE HEALTH INSURANCE

## 2023-09-24 NOTE — Telephone Encounter
 Called radiology at [facility]. Requested 09/03/23 MRA Head clouding (report received). Awaiting images

## 2023-09-24 NOTE — Telephone Encounter
 Images in [facility] - req'd merge to O2.

## 2023-10-05 ENCOUNTER — Encounter: Admit: 2023-10-05 | Discharge: 2023-10-05 | Payer: PRIVATE HEALTH INSURANCE

## 2023-10-05 ENCOUNTER — Ambulatory Visit: Admit: 2023-10-05 | Discharge: 2023-10-06 | Payer: PRIVATE HEALTH INSURANCE

## 2023-10-05 DIAGNOSIS — I671 Cerebral aneurysm, nonruptured: Principal | ICD-10-CM

## 2023-10-05 NOTE — Patient Instructions
 It was a pleasure seeing you in clinic today! Dr. Andra would like to see you back in clinic in 1 year. Prior to your appointment, he would like updated imaging: a MRA. Our scheduling team should contact you to schedule your appointments.      General Instructions:  Scheduling:  Our scheduling phone number is [phone] option #1.   How to reach our office:  Please send a MyChart message to Neurosurgery or leave a voicemail at [phone] for the nurse  How to get a medication refill:  Please use the MyChart Refill request or contact your pharmacy directly to request medication refills.  Please allow 72 business hours for request to be completed.    Support for many chronic illnesses is available through [employer] at [email or web address] or [phone].    For help with MyChart:  please call [phone].    For questions on nights, weekends or holidays:  call the Operator at [phone], and ask for the doctor on call for Neurosurgery.    Neurosurgery fax number: [phone]  Radiology scheduling number: [phone]     Again, thank you for coming in today.

## 2023-10-05 NOTE — Progress Notes
 Date of Service: 10/05/2023    Subjective:             Tracy Mcmillan is a 78 y.o. 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 He has undergone a new one-year MRA of the head for monitoring purposes. The aneurysm measures approximately 6.5-7 millimeters in its widest dimension, which is similar to his previous diagnostic angiogram. The radiologist's report suggests a slight increase in size, but this is likely within the margin of measurement error between different studies. Overall, there are no significant changes observed in the angioarchitecture or size of the aneurysm. The patient has been under observation for this aneurysm for some time, considering his other medical comorbidities. Tracy Mcmillan was also recently diagnosed with a thoracic aneurysm, which is currently being monitored without immediate treatment. The patient reports no new issues or symptoms over the past year.    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 He has not elected to undergo any treatment for the aneurysm at this point, and yearly observation has been ongoing. A new MRA was performed on 06-30-2022, and the patient is here today to review the results and discuss further options. Additionally, he has been experiencing significant chronic neck pain, which is being treated by other providers, and he has recently started taking Lyrica. His aneurysm appears stable and we will continue observation with non-invasive imaging.     08/26/21  Tracy Mcmillan has not had any issues since our last follow-up.  He recently had a new MRA on 08/16/2021.  This overall has stable appearance of a 7 mm anterior communicating artery aneurysm.  Again we discussed that at this point he would like to continue to further follow with noninvasive imaging and observation for the aneurysm.  We discussed the symptoms of subarachnoid hemorrhage and if she should have any of these to present to the emergency room immediately.  Otherwise he is doing well we will plan to have a follow-up in 1 year with an MRA.    05/20/21  Tracy Mcmillan has been doing well since her last visit.  We again today discussed some of the specifics about the web embolization device as well as the potential need for stent assisted coiling if the device was not sitting well inside of the aneurysm.  At this point we also discussed that in general his overall risk in the short-term from the aneurysm rupturing is relatively low.  However, we again discussed that the overall recommendation of the group was for treatment of the aneurysm.  However, the patient does have some significant reluctance to undergoing a procedure which is also reasonable at this point and we again discussed these risk benefits and the alternatives to continued clinical observation versus plans for endovascular treatment of the aneurysm.    At this point the patient would continue to prefer to wait and do some noninvasive evaluation.  We discussed getting an MRA at approximately 3 months from now which would at this point be approximately just over a year out from his original MRA imaging.  If that study shows that there is any evidence of interval growth of the aneurysm we would likely have a stronger recommendation towards treatment.  However if things are stable and the patient is stable at that point we will continue to discuss observation versus any plans going forward of any treatment.    Again we discussed the symptoms of a subarachnoid hemorrhage and to present to the nearest emergency room if these should occur.    05/06/21    We followed up with Alm again today in regards to his anterior communicating artery aneurysm.  We again discussed that overall his apprehension to treatment earlier related to his COPD and concerns around time of intubation.  However, we discussed that at this point we feel like he likely would not have any significant issues of this and he would likely build to be extubated.    He is leaning towards treatment of the aneurysm at this point.  We discussed with our research team and overall based on his COPD he is not a candidate for any of our trial studies right now looking at intra saccular devices.    At this point if we are discussing treatment then we will plan for web treatment of the aneurysm with plans for stent assisted coiling as a backup at that time if the web does not appear to sit well in the aneurysm.    We discussed this with him and at this time he would like to follow-up in 1 more month for a little bit further discussion about the web device and we will go from there for any surgical planning.  We also discussed and will discuss again in the future that he will likely be on dual antiplatelet therapy around the time of the treatment in case we do need to perform stent assisted coiling.    03/25/2021  Tracy Mcmillan again is doing well and is present over telehealth visit with his family.  He has not had any significant changes since we last talked with him.  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  We also discussed that we feel like he is likely a candidate for one of our trial devices and we would screen him for this if we did undergo evaluation for treatment.  We also discussed that he would likely need to be on dual antiplatelet therapy at least around the time of treatment in case it required stent assisted coiling.  We discussed the risk benefits and alternatives to this type of endovascular treatment.  We also discussed the likely treatment course if everything goes well he would likely be in the hospital for 1 day and plans for discharge home after that.    We also discussed that overall he does have some history of difficulties with anesthesia for a minor procedure and they discussed that he would need to be at the hospital for any more of his procedures at that time.  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  We did discuss that prior to procedure he would be evaluated by our anesthesia service and they would determine his overall risks and if he needed any further work-up related to any perioperative care.  Overall though we feel like he tolerated the angiogram fine and that we did have ileus low concerns at this point of performing the procedure.    We also discussed that we would try to get him in contact with our financial counseling as they are submitting his stuff to Medicare and  he is a [facility] patient and some of this should be filtered through them and we discussed that we would help facilitate whenever we need to from a financial standpoint.    At this point again the patient would like a little bit more time thinking about the options.  We will plan to follow-up again in 1 month for further discussion and to answer any questions.  If prior to this the patient should decide that he would like to pursue further treatment of the aneurysm we at that point we will engage the research team for final screening and evaluation of any of our study protocols that would fit for the patient's aneurysm.    Otherwise we also discussed that if he decides that he would rather just go conservative management with observation that we would plan for at least a CT angiogram 1 year after his last evaluation for continued follow-up of the aneurysm.      02/25/21  Amro is overall doing well.  At this time we discussed the angiogram findings with him and his son.  He has questions about the overall plans for potential treatment.  We discussed that overall the recommendation from conference was treatment of his aneurysm which given its size and configuration has a overall phases score of 9 with a 4.3% risk of rupture every 5 years.    Given the location and configuration we do feel like this would be amenable to one of our study devices that we are currently evaluating.  At this point the patient would like to further discuss these options with his family and meet up again in a few weeks for more discussion.    10/22/2020  Donn is following up with a new CT scan of his head.  Overall that imaging shows stable appearance to the increased left frontal region increased subdural space.  This overall does not appear to have any acute hemorrhage or increase in size from his previous imaging.  This may just represent a CSF hygroma related to general cerebral volume loss.  The patient has been doing well without any significant symptoms.    We also discussed at this time that his previous MRA imaging showed evidence of a anterior communicating artery aneurysm.  This measures approximately 6 mm in size.  Overall we discussed that in general the short-term risk of the aneurysm is quite low as related to any rupture risk.  However, we discussed that a diagnostic cerebral angiogram would be beneficial in determining if there is any high risk morphological features related to the aneurysm.    However, we discussed that at this time we would also like a another delayed longer term CT scan to make sure that the subdural space is stable without evidence of any new hemorrhage or changes in size of the subdural space.  Because, if we do see that at that time of the diagnostic cerebral angiogram we would also potentially entertain performing a embolization of the middle meningeal artery for treatment of any subdural hematoma.    If the patient remained stable on that CT scan of the head then at that point we would plan for an outpatient diagnostic cerebral angiogram.  Therefore, we discussed waiting 3 months and then getting the new CT scan of the head at the time of evaluation for the angiogram as a way to limit the number of scans and visits for the patient.    09/17/2020  Eryk is a 78 year old male who was evaluated initially  on July 08, 2020 when he was transferred to [facility] for evaluation of a left-sided subdural hematoma.  He does have a history of chronic migraines but was having different headaches at that time.  He did not have any preceding trauma.  He overall does not take any anticoagulation.  For his headaches however he was taking aspirin regularly.    Upon arrival he had CT scans performed as well as MRI and there were concerns for the left-sided increased fluid space in the subdural region.  There was no evidence of acute blood.  This may be related to a chronic subdural hematoma versus volume loss in the frontal lobes with increased subdural space.    Overall the patient is clinically been stable without any headaches for the last few months.  He was referred to my clinic and it initially appeared that the evaluation was for subdural hematoma.  However, in further review of the charts and imaging the patient did have an MRA performed that was transferred here which does show evidence of a approximately 6 mm anterior communicating artery region aneurysm.  This was not known at the time of the visit and the patient was unsure of any other reasons for follow-up for this appointment.    The plan at this point is to get a CT head in approximately 1 month and revisit given the time since the initial evaluation for potential chronic subdural hematoma.  This may actually be advantageous to get the CT scan before any discussion about angiogram or further evaluation of the aneurysm given that if there is evidence of growth or changes of the left subdural space concerning for chronic subdural hematoma this may be amenable to endovascular treatment with middle meningeal artery embolization. It would be better to have that information prior to any discussion of the aneurysm which overall is small in size and carries a low risk for any rupture.    Patient is a current smoker.       Review of Systems  Negative except HPI    Objective:          aspirin 325 mg tablet Take one tablet by mouth as Needed for Pain. Take with food. (Patient not taking: Reported on 10/05/2023)    aspirin EC 81 mg tablet Take one tablet by mouth daily. Take with food.    cetirizine (ZYRTEC) 10 mg tablet Take one tablet by mouth.    chlorthalidone (HYGROTON) 25 mg tablet Take one-half tablet by mouth daily.    cholecalciferol  (vitamin D3) (D3-2000 PO) Take 1 tablet by mouth daily.    cyclobenzaprine (FLEXERIL) 10 mg tablet Take one tablet by mouth daily as needed for Muscle Cramps. (Patient not taking: Reported on 10/05/2023)    Epinephrine Base 0.3 mg/0.3 mL syrg Inject  to area(s) as directed. Indications: a significant type of allergic reaction called anaphylaxis, bee venom allergy (Patient not taking: Reported on 10/05/2023)    fluticasone propionate (FLONASE) 50 mcg/actuation nasal spray, suspension Apply two sprays to each nostril as directed daily. Shake bottle gently before using. (Patient not taking: Reported on 10/05/2023)    gabapentin  (NEURONTIN ) 300 mg capsule Take one capsule by mouth three times daily as needed. (Patient not taking: Reported on 10/05/2023)    indomethacin (INDOCIN) 25 mg capsule Take one capsule by mouth twice daily. (Patient not taking: Reported on 10/05/2023)    levothyroxine  (SYNTHROID) 50 mcg tablet Take one tablet by mouth daily 30 minutes before breakfast.    lisinopriL  (ZESTRIL ) 40  mg tablet Take one tablet by mouth daily.    lisinopriL -hydrochlorothiazide  (ZESTORETIC ) 20-12.5 mg tablet Take two tablets by mouth daily. (Patient not taking: Reported on 10/05/2023)    loratadine  (CLARITIN ) 10 mg tablet Take one tablet by mouth every morning. (Patient not taking: Reported on 10/05/2023)    omeprazole DR (PRILOSEC) 40 mg capsule Take one capsule by mouth daily before breakfast.    oxyCODONE  (ROXICODONE ) 5 mg tablet Take one tablet by mouth every 6 hours as needed for Pain. (Patient not taking: Reported on 10/05/2023)    pregabalin (LYRICA) 75 mg capsule Take one capsule by mouth twice daily.    rosuvastatin  (CRESTOR ) 5 mg tablet Take one-half tablet by mouth three times weekly.    SUMAtriptan succinate (IMITREX) 50 mg tablet Take one tablet by mouth once. Take one tablet by mouth at onset of headache. May repeat after 2 hours if needed. Max of 200 mg in 24 hours. (Patient not taking: Reported on 10/05/2023)     There were no vitals filed for this visit.  There is no height or weight on file to calculate BMI.     Physical Exam   Over teleconference  Awake, alert, oriented  Face symmetric  Speech regular rhythm rate and tone  Lifts both arms above head difficulty    Encounter Diagnoses   Name Primary?    Cerebral aneurysm Yes                Imaging:    07/08/20 CT head      07/08/20 MRA head - acomm region aneurysm        10/22/20 CT head      01/19/21 CT head  Stable left subdural hygroma        01/19/21 Cerebral angiogram - acomm aneurysm        08/16/21 MRA head      06/30/2022 MRA head        09/03/23 MRA head    Assessment and Plan:    Emric is a 78 y.o. male who was initially seen for a subdural hematoma.  He was transferred to [facility] for evaluation of this.  In review of his charts and images from the [facility] it was evident that he does have a MRA showing a anterior communicating artery aneurysm for which is he followed for over the years.      Anterior Communicating Artery Aneurysm: The patient's anterior communicating artery aneurysm appears stable based on the latest MRA results. The aneurysm measures approximately 6.5-7 millimeters, which is consistent with previous imaging. Although the radiologist's report suggested a slight increase in size, this is likely within the margin of measurement error between different studies. No significant changes in angioarchitecture or size were observed.    - Continue observation of the aneurysm    - Schedule follow-up MRA in one year    - Continue baby aspirin for blood vessel health    - Monitor for any new symptoms or changes    - Consider repeating angiogram if concerning changes are observed in the future    - Discuss potential treatment options, including new devices, as they become available    - Continue monitoring thoracic aneurysm as per current plan    Thoracic Aneurysm: The patient has been recently diagnosed with a thoracic aneurysm, which is currently being monitored without immediate treatment.    - Continue monitoring as per current plan    - Coordinate care with the appropriate specialists    - Reassess  treatment options if the aneurysm size or symptoms change           ATTESTATION    Video Time Documentation:  Spent 16 minutes with pt face to face and more that 50% of this time was spent in counseling and coordination of care.    Staff name:  Venetia JAYSON Endo, MD Date: 10/05/2023

## 2024-03-21 ENCOUNTER — Encounter: Admit: 2024-03-21 | Discharge: 2024-03-21 | Payer: PRIVATE HEALTH INSURANCE
